# Patient Record
Sex: Female | Born: 1940 | Race: White | Hispanic: No | Smoking: Never smoker
Health system: Southern US, Community
[De-identification: ages and names within clinical notes are randomized; demographics above are authoritative.]

## PROBLEM LIST (undated history)

## (undated) DIAGNOSIS — F0390 Unspecified dementia without behavioral disturbance: Secondary | ICD-10-CM

## (undated) DIAGNOSIS — T7840XA Allergy, unspecified, initial encounter: Secondary | ICD-10-CM

## (undated) DIAGNOSIS — F32A Depression, unspecified: Secondary | ICD-10-CM

## (undated) DIAGNOSIS — E079 Disorder of thyroid, unspecified: Secondary | ICD-10-CM

## (undated) DIAGNOSIS — F329 Major depressive disorder, single episode, unspecified: Secondary | ICD-10-CM

## (undated) HISTORY — DX: Depression, unspecified: F32.A

## (undated) HISTORY — DX: Disorder of thyroid, unspecified: E07.9

## (undated) HISTORY — DX: Allergy, unspecified, initial encounter: T78.40XA

## (undated) HISTORY — DX: Major depressive disorder, single episode, unspecified: F32.9

---

## 1986-10-18 HISTORY — PX: OTHER SURGICAL HISTORY: SHX169

## 1997-02-26 ENCOUNTER — Encounter: Payer: Self-pay | Admitting: Family Medicine

## 1997-02-26 LAB — CONVERTED CEMR LAB: Pap Smear: NORMAL

## 1998-02-25 ENCOUNTER — Other Ambulatory Visit: Admission: RE | Admit: 1998-02-25 | Discharge: 1998-02-25 | Payer: Self-pay | Admitting: Obstetrics and Gynecology

## 1998-08-01 ENCOUNTER — Ambulatory Visit (HOSPITAL_COMMUNITY): Admission: RE | Admit: 1998-08-01 | Discharge: 1998-08-01 | Payer: Self-pay | Admitting: Family Medicine

## 1998-08-01 ENCOUNTER — Encounter: Payer: Self-pay | Admitting: Family Medicine

## 1999-08-20 ENCOUNTER — Encounter: Admission: RE | Admit: 1999-08-20 | Discharge: 1999-08-20 | Payer: Self-pay | Admitting: Family Medicine

## 1999-08-20 ENCOUNTER — Encounter: Payer: Self-pay | Admitting: Family Medicine

## 2000-07-12 ENCOUNTER — Encounter: Payer: Self-pay | Admitting: Neurological Surgery

## 2000-07-12 ENCOUNTER — Ambulatory Visit (HOSPITAL_COMMUNITY): Admission: RE | Admit: 2000-07-12 | Discharge: 2000-07-12 | Payer: Self-pay | Admitting: Neurological Surgery

## 2000-07-24 ENCOUNTER — Ambulatory Visit (HOSPITAL_COMMUNITY): Admission: RE | Admit: 2000-07-24 | Discharge: 2000-07-24 | Payer: Self-pay | Admitting: Neurological Surgery

## 2000-07-24 ENCOUNTER — Encounter: Payer: Self-pay | Admitting: Neurological Surgery

## 2000-08-11 ENCOUNTER — Encounter: Admission: RE | Admit: 2000-08-11 | Discharge: 2000-08-24 | Payer: Self-pay | Admitting: Neurological Surgery

## 2000-08-22 ENCOUNTER — Encounter: Payer: Self-pay | Admitting: Family Medicine

## 2000-08-22 ENCOUNTER — Encounter: Admission: RE | Admit: 2000-08-22 | Discharge: 2000-08-22 | Payer: Self-pay | Admitting: Family Medicine

## 2000-09-01 ENCOUNTER — Other Ambulatory Visit: Admission: RE | Admit: 2000-09-01 | Discharge: 2000-09-01 | Payer: Self-pay | Admitting: Family Medicine

## 2000-09-01 ENCOUNTER — Encounter: Payer: Self-pay | Admitting: Family Medicine

## 2000-09-01 LAB — CONVERTED CEMR LAB: Pap Smear: NORMAL

## 2001-08-23 ENCOUNTER — Encounter: Payer: Self-pay | Admitting: Family Medicine

## 2001-08-23 ENCOUNTER — Encounter: Admission: RE | Admit: 2001-08-23 | Discharge: 2001-08-23 | Payer: Self-pay | Admitting: Family Medicine

## 2001-09-20 ENCOUNTER — Other Ambulatory Visit: Admission: RE | Admit: 2001-09-20 | Discharge: 2001-09-20 | Payer: Self-pay | Admitting: Family Medicine

## 2001-09-25 ENCOUNTER — Encounter: Payer: Self-pay | Admitting: Family Medicine

## 2001-09-25 LAB — CONVERTED CEMR LAB: Pap Smear: NORMAL

## 2002-09-27 ENCOUNTER — Encounter: Payer: Self-pay | Admitting: Family Medicine

## 2002-09-27 ENCOUNTER — Encounter: Admission: RE | Admit: 2002-09-27 | Discharge: 2002-09-27 | Payer: Self-pay | Admitting: Family Medicine

## 2002-10-23 ENCOUNTER — Other Ambulatory Visit: Admission: RE | Admit: 2002-10-23 | Discharge: 2002-10-23 | Payer: Self-pay | Admitting: Family Medicine

## 2002-10-23 ENCOUNTER — Encounter: Payer: Self-pay | Admitting: Family Medicine

## 2002-10-23 LAB — CONVERTED CEMR LAB: Pap Smear: NORMAL

## 2003-02-21 ENCOUNTER — Encounter (INDEPENDENT_AMBULATORY_CARE_PROVIDER_SITE_OTHER): Payer: Self-pay | Admitting: Specialist

## 2003-02-21 ENCOUNTER — Ambulatory Visit (HOSPITAL_COMMUNITY): Admission: RE | Admit: 2003-02-21 | Discharge: 2003-02-21 | Payer: Self-pay | Admitting: *Deleted

## 2003-08-26 HISTORY — PX: OTHER SURGICAL HISTORY: SHX169

## 2003-08-30 ENCOUNTER — Encounter: Admission: RE | Admit: 2003-08-30 | Discharge: 2003-08-30 | Payer: Self-pay | Admitting: Family Medicine

## 2004-06-03 ENCOUNTER — Encounter: Admission: RE | Admit: 2004-06-03 | Discharge: 2004-06-03 | Payer: Self-pay | Admitting: Family Medicine

## 2004-06-05 ENCOUNTER — Encounter: Admission: RE | Admit: 2004-06-05 | Discharge: 2004-06-05 | Payer: Self-pay | Admitting: Surgery

## 2004-06-15 ENCOUNTER — Ambulatory Visit (HOSPITAL_COMMUNITY): Admission: RE | Admit: 2004-06-15 | Discharge: 2004-06-16 | Payer: Self-pay | Admitting: Surgery

## 2004-06-15 ENCOUNTER — Encounter (INDEPENDENT_AMBULATORY_CARE_PROVIDER_SITE_OTHER): Payer: Self-pay | Admitting: Specialist

## 2004-06-15 HISTORY — PX: CHOLECYSTECTOMY: SHX55

## 2004-07-24 ENCOUNTER — Other Ambulatory Visit: Admission: RE | Admit: 2004-07-24 | Discharge: 2004-07-24 | Payer: Self-pay | Admitting: Family Medicine

## 2004-07-27 ENCOUNTER — Encounter: Payer: Self-pay | Admitting: Family Medicine

## 2004-07-27 LAB — CONVERTED CEMR LAB: Pap Smear: NORMAL

## 2004-09-04 ENCOUNTER — Encounter: Admission: RE | Admit: 2004-09-04 | Discharge: 2004-09-04 | Payer: Self-pay | Admitting: Surgery

## 2004-09-17 ENCOUNTER — Ambulatory Visit: Payer: Self-pay | Admitting: Family Medicine

## 2004-10-26 ENCOUNTER — Ambulatory Visit: Payer: Self-pay | Admitting: Family Medicine

## 2004-11-16 ENCOUNTER — Ambulatory Visit: Payer: Self-pay | Admitting: Family Medicine

## 2004-12-02 ENCOUNTER — Ambulatory Visit: Payer: Self-pay | Admitting: Family Medicine

## 2005-01-21 ENCOUNTER — Ambulatory Visit: Payer: Self-pay | Admitting: Family Medicine

## 2005-03-24 ENCOUNTER — Ambulatory Visit: Payer: Self-pay | Admitting: Family Medicine

## 2005-04-19 ENCOUNTER — Ambulatory Visit: Payer: Self-pay | Admitting: Family Medicine

## 2005-04-27 ENCOUNTER — Ambulatory Visit: Payer: Self-pay | Admitting: Family Medicine

## 2005-07-02 ENCOUNTER — Encounter: Admission: RE | Admit: 2005-07-02 | Discharge: 2005-07-02 | Payer: Self-pay | Admitting: Family Medicine

## 2005-07-29 ENCOUNTER — Ambulatory Visit: Payer: Self-pay | Admitting: Family Medicine

## 2005-08-03 ENCOUNTER — Ambulatory Visit: Payer: Self-pay | Admitting: Family Medicine

## 2005-08-04 ENCOUNTER — Encounter: Admission: RE | Admit: 2005-08-04 | Discharge: 2005-08-04 | Payer: Self-pay | Admitting: Family Medicine

## 2005-08-31 ENCOUNTER — Ambulatory Visit: Payer: Self-pay | Admitting: Family Medicine

## 2005-09-14 ENCOUNTER — Ambulatory Visit: Payer: Self-pay | Admitting: Family Medicine

## 2005-11-02 ENCOUNTER — Ambulatory Visit: Payer: Self-pay | Admitting: Family Medicine

## 2005-12-15 ENCOUNTER — Ambulatory Visit: Payer: Self-pay | Admitting: Family Medicine

## 2005-12-20 ENCOUNTER — Ambulatory Visit: Payer: Self-pay | Admitting: Internal Medicine

## 2006-07-20 ENCOUNTER — Encounter: Admission: RE | Admit: 2006-07-20 | Discharge: 2006-07-20 | Payer: Self-pay | Admitting: Family Medicine

## 2006-08-24 ENCOUNTER — Ambulatory Visit: Payer: Self-pay | Admitting: Family Medicine

## 2006-11-29 ENCOUNTER — Ambulatory Visit: Payer: Self-pay | Admitting: Family Medicine

## 2006-11-29 LAB — CONVERTED CEMR LAB
ALT: 18 units/L (ref 0–40)
AST: 24 units/L (ref 0–37)
Albumin: 4.2 g/dL (ref 3.5–5.2)
BUN: 20 mg/dL (ref 6–23)
Basophils Absolute: 0 10*3/uL (ref 0.0–0.1)
Basophils Relative: 0.2 % (ref 0.0–1.0)
CO2: 28 meq/L (ref 19–32)
Calcium: 9.2 mg/dL (ref 8.4–10.5)
Chloride: 103 meq/L (ref 96–112)
Cholesterol: 297 mg/dL (ref 0–200)
Creatinine, Ser: 1 mg/dL (ref 0.4–1.2)
Direct LDL: 201.4 mg/dL
Eosinophils Absolute: 0.1 10*3/uL (ref 0.0–0.6)
Eosinophils Relative: 1.5 % (ref 0.0–5.0)
Folate: >20 ng/mL
GFR calc Af Amer: 72 mL/min
GFR calc non Af Amer: 59 mL/min
Glucose, Bld: 68 mg/dL — ABNORMAL LOW (ref 70–99)
HCT: 36.5 % (ref 36.0–46.0)
HDL: 36.7 mg/dL — ABNORMAL LOW (ref >39.0)
Hemoglobin: 12.6 g/dL (ref 12.0–15.0)
Lymphocytes Relative: 32.7 % (ref 12.0–46.0)
MCHC: 34.6 g/dL (ref 30.0–36.0)
MCV: 87.7 fL (ref 78.0–100.0)
Monocytes Absolute: 0.4 10*3/uL (ref 0.2–0.7)
Monocytes Relative: 9.4 % (ref 3.0–11.0)
Neutro Abs: 2.7 10*3/uL (ref 1.4–7.7)
Neutrophils Relative %: 56.2 % (ref 43.0–77.0)
Phosphorus: 4.1 mg/dL (ref 2.3–4.6)
Platelets: 176 10*3/uL (ref 150–400)
Potassium: 4.7 meq/L (ref 3.5–5.1)
RBC: 4.16 M/uL (ref 3.87–5.11)
RDW: 13.3 % (ref 11.5–14.6)
Sodium: 140 meq/L (ref 135–145)
TSH: 1.14 microintl units/mL (ref 0.35–5.50)
Total CHOL/HDL Ratio: 8.1
Triglycerides: 257 mg/dL (ref 0–149)
VLDL: 51 mg/dL — ABNORMAL HIGH (ref 0–40)
Vit D, 1,25-Dihydroxy: 39 (ref 20–57)
Vitamin B-12: 252 pg/mL (ref 211–911)
WBC: 4.7 10*3/uL (ref 4.5–10.5)

## 2006-12-20 ENCOUNTER — Ambulatory Visit: Payer: Self-pay | Admitting: Family Medicine

## 2006-12-26 ENCOUNTER — Ambulatory Visit: Payer: Self-pay | Admitting: Family Medicine

## 2007-01-18 ENCOUNTER — Encounter: Payer: Self-pay | Admitting: Family Medicine

## 2007-01-18 DIAGNOSIS — I872 Venous insufficiency (chronic) (peripheral): Secondary | ICD-10-CM | POA: Insufficient documentation

## 2007-01-18 DIAGNOSIS — F329 Major depressive disorder, single episode, unspecified: Secondary | ICD-10-CM

## 2007-01-18 DIAGNOSIS — M545 Low back pain, unspecified: Secondary | ICD-10-CM | POA: Insufficient documentation

## 2007-01-18 DIAGNOSIS — F39 Unspecified mood [affective] disorder: Secondary | ICD-10-CM | POA: Insufficient documentation

## 2007-01-18 DIAGNOSIS — F32A Depression, unspecified: Secondary | ICD-10-CM | POA: Insufficient documentation

## 2007-01-18 DIAGNOSIS — M810 Age-related osteoporosis without current pathological fracture: Secondary | ICD-10-CM | POA: Insufficient documentation

## 2007-01-18 DIAGNOSIS — J309 Allergic rhinitis, unspecified: Secondary | ICD-10-CM | POA: Insufficient documentation

## 2007-01-18 DIAGNOSIS — E039 Hypothyroidism, unspecified: Secondary | ICD-10-CM | POA: Insufficient documentation

## 2007-01-18 DIAGNOSIS — M47812 Spondylosis without myelopathy or radiculopathy, cervical region: Secondary | ICD-10-CM | POA: Insufficient documentation

## 2007-04-10 ENCOUNTER — Encounter: Payer: Self-pay | Admitting: Family Medicine

## 2007-07-13 ENCOUNTER — Encounter (INDEPENDENT_AMBULATORY_CARE_PROVIDER_SITE_OTHER): Payer: Self-pay | Admitting: *Deleted

## 2007-07-27 ENCOUNTER — Encounter: Admission: RE | Admit: 2007-07-27 | Discharge: 2007-07-27 | Payer: Self-pay | Admitting: Family Medicine

## 2007-08-02 ENCOUNTER — Encounter (INDEPENDENT_AMBULATORY_CARE_PROVIDER_SITE_OTHER): Payer: Self-pay | Admitting: *Deleted

## 2007-09-20 ENCOUNTER — Ambulatory Visit: Payer: Self-pay | Admitting: Family Medicine

## 2007-10-24 ENCOUNTER — Ambulatory Visit: Payer: Self-pay | Admitting: Family Medicine

## 2007-10-24 DIAGNOSIS — M542 Cervicalgia: Secondary | ICD-10-CM | POA: Insufficient documentation

## 2007-10-24 DIAGNOSIS — J329 Chronic sinusitis, unspecified: Secondary | ICD-10-CM | POA: Insufficient documentation

## 2007-11-16 ENCOUNTER — Telehealth: Payer: Self-pay | Admitting: Family Medicine

## 2008-02-20 ENCOUNTER — Encounter (INDEPENDENT_AMBULATORY_CARE_PROVIDER_SITE_OTHER): Payer: Self-pay | Admitting: *Deleted

## 2008-04-08 ENCOUNTER — Telehealth (INDEPENDENT_AMBULATORY_CARE_PROVIDER_SITE_OTHER): Payer: Self-pay | Admitting: Internal Medicine

## 2008-04-09 ENCOUNTER — Ambulatory Visit: Payer: Self-pay | Admitting: Internal Medicine

## 2008-04-09 DIAGNOSIS — R079 Chest pain, unspecified: Secondary | ICD-10-CM | POA: Insufficient documentation

## 2008-04-10 LAB — CONVERTED CEMR LAB
ALT: 13 units/L (ref 0–35)
AST: 21 units/L (ref 0–37)
BUN: 19 mg/dL (ref 6–23)
Basophils Absolute: 0 10*3/uL (ref 0.0–0.1)
Basophils Relative: 0.1 % (ref 0.0–1.0)
CO2: 29 meq/L (ref 19–32)
Calcium: 9.2 mg/dL (ref 8.4–10.5)
Chloride: 105 meq/L (ref 96–112)
Cholesterol: 296 mg/dL (ref 0–200)
Creatinine, Ser: 1.2 mg/dL (ref 0.4–1.2)
Direct LDL: 195.4 mg/dL
Eosinophils Absolute: 0.1 10*3/uL (ref 0.0–0.7)
Eosinophils Relative: 3 % (ref 0.0–5.0)
GFR calc Af Amer: 58 mL/min
GFR calc non Af Amer: 48 mL/min
Glucose, Bld: 86 mg/dL (ref 70–99)
HCT: 35.7 % — ABNORMAL LOW (ref 36.0–46.0)
HDL: 34.1 mg/dL — ABNORMAL LOW (ref >39.0)
Hemoglobin: 12.3 g/dL (ref 12.0–15.0)
Lymphocytes Relative: 30.7 % (ref 12.0–46.0)
MCHC: 34.6 g/dL (ref 30.0–36.0)
MCV: 88.1 fL (ref 78.0–100.0)
Monocytes Absolute: 0.4 10*3/uL (ref 0.1–1.0)
Monocytes Relative: 9.4 % (ref 3.0–12.0)
Neutro Abs: 2.6 10*3/uL (ref 1.4–7.7)
Neutrophils Relative %: 56.8 % (ref 43.0–77.0)
Platelets: 161 10*3/uL (ref 150–400)
Potassium: 4.5 meq/L (ref 3.5–5.1)
RBC: 4.05 M/uL (ref 3.87–5.11)
RDW: 13 % (ref 11.5–14.6)
Sodium: 142 meq/L (ref 135–145)
TSH: 0.41 microintl units/mL (ref 0.35–5.50)
Total CHOL/HDL Ratio: 8.7
Triglycerides: 194 mg/dL — ABNORMAL HIGH (ref 0–149)
VLDL: 39 mg/dL (ref 0–40)
WBC: 4.5 10*3/uL (ref 4.5–10.5)

## 2008-04-17 ENCOUNTER — Ambulatory Visit: Payer: Self-pay | Admitting: Family Medicine

## 2008-04-17 DIAGNOSIS — Z8679 Personal history of other diseases of the circulatory system: Secondary | ICD-10-CM | POA: Insufficient documentation

## 2008-04-22 ENCOUNTER — Telehealth (INDEPENDENT_AMBULATORY_CARE_PROVIDER_SITE_OTHER): Payer: Self-pay | Admitting: Internal Medicine

## 2008-04-24 ENCOUNTER — Ambulatory Visit: Payer: Self-pay

## 2008-04-24 ENCOUNTER — Encounter (INDEPENDENT_AMBULATORY_CARE_PROVIDER_SITE_OTHER): Payer: Self-pay | Admitting: Internal Medicine

## 2008-04-24 ENCOUNTER — Encounter: Payer: Self-pay | Admitting: Family Medicine

## 2008-04-28 ENCOUNTER — Encounter: Payer: Self-pay | Admitting: Internal Medicine

## 2008-05-02 ENCOUNTER — Telehealth (INDEPENDENT_AMBULATORY_CARE_PROVIDER_SITE_OTHER): Payer: Self-pay | Admitting: Internal Medicine

## 2008-05-20 ENCOUNTER — Ambulatory Visit: Payer: Self-pay | Admitting: Family Medicine

## 2008-05-20 DIAGNOSIS — R5381 Other malaise: Secondary | ICD-10-CM | POA: Insufficient documentation

## 2008-05-20 DIAGNOSIS — R5383 Other fatigue: Secondary | ICD-10-CM

## 2008-05-21 ENCOUNTER — Encounter (INDEPENDENT_AMBULATORY_CARE_PROVIDER_SITE_OTHER): Payer: Self-pay | Admitting: Internal Medicine

## 2008-05-22 ENCOUNTER — Telehealth (INDEPENDENT_AMBULATORY_CARE_PROVIDER_SITE_OTHER): Payer: Self-pay | Admitting: Internal Medicine

## 2008-05-22 LAB — CONVERTED CEMR LAB
Cholesterol: 291 mg/dL (ref 0–200)
Direct LDL: 194.4 mg/dL
HDL: 34.5 mg/dL — ABNORMAL LOW (ref >39.0)
Total CHOL/HDL Ratio: 8.4
Triglycerides: 228 mg/dL (ref 0–149)
VLDL: 46 mg/dL — ABNORMAL HIGH (ref 0–40)
Vitamin B-12: 302 pg/mL (ref 211–911)

## 2008-05-23 LAB — CONVERTED CEMR LAB: Vit D, 1,25-Dihydroxy: 43 (ref 30–89)

## 2008-07-29 ENCOUNTER — Encounter: Admission: RE | Admit: 2008-07-29 | Discharge: 2008-07-29 | Payer: Self-pay | Admitting: Family Medicine

## 2008-07-30 ENCOUNTER — Ambulatory Visit: Payer: Self-pay | Admitting: Family Medicine

## 2008-07-31 LAB — CONVERTED CEMR LAB
ALT: 18 units/L (ref 0–35)
AST: 20 units/L (ref 0–37)
Albumin: 4 g/dL (ref 3.5–5.2)
Alkaline Phosphatase: 40 units/L (ref 39–117)
Bilirubin, Direct: 0.1 mg/dL (ref 0.0–0.3)
Cholesterol: 192 mg/dL (ref 0–200)
HDL: 41.3 mg/dL (ref >39.0)
LDL Cholesterol: 123 mg/dL — ABNORMAL HIGH (ref 0–99)
Total Bilirubin: 0.9 mg/dL (ref 0.3–1.2)
Total CHOL/HDL Ratio: 4.6
Total Protein: 7.3 g/dL (ref 6.0–8.3)
Triglycerides: 137 mg/dL (ref 0–149)
VLDL: 27 mg/dL (ref 0–40)

## 2008-10-31 ENCOUNTER — Encounter: Admission: RE | Admit: 2008-10-31 | Discharge: 2008-10-31 | Payer: Self-pay | Admitting: Family Medicine

## 2008-10-31 ENCOUNTER — Ambulatory Visit: Payer: Self-pay | Admitting: Family Medicine

## 2008-10-31 DIAGNOSIS — M25559 Pain in unspecified hip: Secondary | ICD-10-CM | POA: Insufficient documentation

## 2009-01-27 ENCOUNTER — Ambulatory Visit: Payer: Self-pay | Admitting: Family Medicine

## 2009-01-28 LAB — CONVERTED CEMR LAB
ALT: 19 units/L (ref 0–35)
AST: 25 units/L (ref 0–37)
Cholesterol: 192 mg/dL (ref 0–200)
HDL: 37.8 mg/dL — ABNORMAL LOW (ref >39.00)
LDL Cholesterol: 124 mg/dL — ABNORMAL HIGH (ref 0–99)
Total CHOL/HDL Ratio: 5
Triglycerides: 152 mg/dL — ABNORMAL HIGH (ref 0.0–149.0)
VLDL: 30.4 mg/dL (ref 0.0–40.0)

## 2009-01-30 ENCOUNTER — Encounter (INDEPENDENT_AMBULATORY_CARE_PROVIDER_SITE_OTHER): Payer: Self-pay | Admitting: Internal Medicine

## 2009-01-30 ENCOUNTER — Telehealth (INDEPENDENT_AMBULATORY_CARE_PROVIDER_SITE_OTHER): Payer: Self-pay | Admitting: Internal Medicine

## 2009-01-30 ENCOUNTER — Ambulatory Visit: Payer: Self-pay | Admitting: Family Medicine

## 2009-01-30 DIAGNOSIS — H6120 Impacted cerumen, unspecified ear: Secondary | ICD-10-CM | POA: Insufficient documentation

## 2009-01-30 LAB — CONVERTED CEMR LAB: TSH: 0.11 microintl units/mL — ABNORMAL LOW (ref 0.35–5.50)

## 2009-02-19 ENCOUNTER — Encounter (INDEPENDENT_AMBULATORY_CARE_PROVIDER_SITE_OTHER): Payer: Self-pay | Admitting: Internal Medicine

## 2009-03-13 ENCOUNTER — Encounter (INDEPENDENT_AMBULATORY_CARE_PROVIDER_SITE_OTHER): Payer: Self-pay | Admitting: Internal Medicine

## 2009-03-13 ENCOUNTER — Ambulatory Visit: Payer: Self-pay | Admitting: Family Medicine

## 2009-03-14 LAB — CONVERTED CEMR LAB: TSH: 0.08 microintl units/mL — ABNORMAL LOW (ref 0.35–5.50)

## 2009-03-28 ENCOUNTER — Encounter (INDEPENDENT_AMBULATORY_CARE_PROVIDER_SITE_OTHER): Payer: Self-pay | Admitting: Internal Medicine

## 2009-05-08 ENCOUNTER — Ambulatory Visit: Payer: Self-pay | Admitting: Family Medicine

## 2009-05-09 LAB — CONVERTED CEMR LAB: TSH: 0.49 microintl units/mL (ref 0.35–5.50)

## 2009-07-31 ENCOUNTER — Ambulatory Visit: Payer: Self-pay | Admitting: Family Medicine

## 2009-08-05 LAB — CONVERTED CEMR LAB
HDL: 38.9 mg/dL — ABNORMAL LOW (ref >39.00)
Total CHOL/HDL Ratio: 4
Triglycerides: 177 mg/dL — ABNORMAL HIGH (ref 0.0–149.0)

## 2009-08-13 ENCOUNTER — Ambulatory Visit: Payer: Self-pay | Admitting: Family Medicine

## 2009-08-13 DIAGNOSIS — K219 Gastro-esophageal reflux disease without esophagitis: Secondary | ICD-10-CM | POA: Insufficient documentation

## 2009-09-04 ENCOUNTER — Encounter: Admission: RE | Admit: 2009-09-04 | Discharge: 2009-09-04 | Payer: Self-pay | Admitting: Family Medicine

## 2009-09-16 ENCOUNTER — Ambulatory Visit: Payer: Self-pay | Admitting: Family Medicine

## 2009-09-16 ENCOUNTER — Encounter: Admission: RE | Admit: 2009-09-16 | Discharge: 2009-09-16 | Payer: Self-pay | Admitting: Family Medicine

## 2009-10-03 ENCOUNTER — Telehealth (INDEPENDENT_AMBULATORY_CARE_PROVIDER_SITE_OTHER): Payer: Self-pay | Admitting: Internal Medicine

## 2009-11-07 ENCOUNTER — Ambulatory Visit: Payer: Self-pay | Admitting: Family Medicine

## 2009-11-10 LAB — CONVERTED CEMR LAB
BUN: 16 mg/dL
CO2: 28 meq/L
Calcium: 9.2 mg/dL
Chloride: 105 meq/L
Creatinine, Ser: 1.4 mg/dL — ABNORMAL HIGH
GFR calc non Af Amer: 39.72 mL/min
Glucose, Bld: 90 mg/dL
Potassium: 3.9 meq/L
Sodium: 140 meq/L
TSH: 6.36 u[IU]/mL — ABNORMAL HIGH

## 2009-11-12 ENCOUNTER — Ambulatory Visit: Payer: Self-pay | Admitting: Family Medicine

## 2009-11-12 DIAGNOSIS — D509 Iron deficiency anemia, unspecified: Secondary | ICD-10-CM | POA: Insufficient documentation

## 2009-11-12 DIAGNOSIS — N189 Chronic kidney disease, unspecified: Secondary | ICD-10-CM | POA: Insufficient documentation

## 2009-11-12 DIAGNOSIS — D631 Anemia in chronic kidney disease: Secondary | ICD-10-CM | POA: Insufficient documentation

## 2009-11-12 DIAGNOSIS — Z87448 Personal history of other diseases of urinary system: Secondary | ICD-10-CM | POA: Insufficient documentation

## 2009-11-12 LAB — CONVERTED CEMR LAB
Bilirubin Urine: NEGATIVE
Ketones, urine, test strip: NEGATIVE
Nitrite: NEGATIVE
Urobilinogen, UA: 0.2
pH: 6

## 2009-11-13 LAB — CONVERTED CEMR LAB
Chloride: 107 meq/L (ref 96–112)
Glucose, Bld: 64 mg/dL — ABNORMAL LOW (ref 70–99)
Phosphorus: 3.7 mg/dL (ref 2.3–4.6)
Potassium: 4.1 meq/L (ref 3.5–5.1)
Sodium: 142 meq/L (ref 135–145)
TSH: 6.43 microintl units/mL — ABNORMAL HIGH (ref 0.35–5.50)

## 2009-11-14 ENCOUNTER — Telehealth: Payer: Self-pay | Admitting: Family Medicine

## 2009-11-27 ENCOUNTER — Ambulatory Visit: Payer: Self-pay | Admitting: Family Medicine

## 2009-12-03 ENCOUNTER — Encounter: Payer: Self-pay | Admitting: Family Medicine

## 2009-12-03 DIAGNOSIS — R0989 Other specified symptoms and signs involving the circulatory and respiratory systems: Secondary | ICD-10-CM | POA: Insufficient documentation

## 2009-12-04 ENCOUNTER — Encounter: Payer: Self-pay | Admitting: Family Medicine

## 2009-12-04 ENCOUNTER — Ambulatory Visit: Payer: Self-pay

## 2009-12-08 ENCOUNTER — Encounter: Payer: Self-pay | Admitting: Family Medicine

## 2009-12-15 ENCOUNTER — Telehealth: Payer: Self-pay | Admitting: Family Medicine

## 2009-12-23 ENCOUNTER — Telehealth: Payer: Self-pay | Admitting: Family Medicine

## 2010-01-02 ENCOUNTER — Encounter: Payer: Self-pay | Admitting: Family Medicine

## 2010-01-13 ENCOUNTER — Encounter: Payer: Self-pay | Admitting: Family Medicine

## 2010-01-17 ENCOUNTER — Encounter: Admission: RE | Admit: 2010-01-17 | Discharge: 2010-01-17 | Payer: Self-pay | Admitting: Neurological Surgery

## 2010-01-23 ENCOUNTER — Encounter: Payer: Self-pay | Admitting: Family Medicine

## 2010-02-04 ENCOUNTER — Ambulatory Visit: Payer: Self-pay | Admitting: Family Medicine

## 2010-02-05 LAB — CONVERTED CEMR LAB
Cholesterol: 162 mg/dL (ref 0–200)
Direct LDL: 75.5 mg/dL
TSH: 4.72 microintl units/mL (ref 0.35–5.50)
Triglycerides: 265 mg/dL — ABNORMAL HIGH (ref 0.0–149.0)

## 2010-03-04 ENCOUNTER — Encounter: Payer: Self-pay | Admitting: Family Medicine

## 2010-03-30 ENCOUNTER — Ambulatory Visit: Payer: Self-pay | Admitting: Family Medicine

## 2010-03-31 LAB — CONVERTED CEMR LAB
ALT: 18 units/L (ref 0–35)
Albumin: 4 g/dL (ref 3.5–5.2)
Cholesterol: 226 mg/dL — ABNORMAL HIGH (ref 0–200)
Direct LDL: 126.7 mg/dL
HDL: 44.3 mg/dL (ref >39.00)
Total Protein: 6.8 g/dL (ref 6.0–8.3)
Triglycerides: 316 mg/dL — ABNORMAL HIGH (ref 0.0–149.0)
VLDL: 63.2 mg/dL — ABNORMAL HIGH (ref 0.0–40.0)

## 2010-04-06 ENCOUNTER — Telehealth: Payer: Self-pay | Admitting: Family Medicine

## 2010-05-20 ENCOUNTER — Telehealth: Payer: Self-pay | Admitting: Family Medicine

## 2010-05-25 ENCOUNTER — Telehealth: Payer: Self-pay | Admitting: Family Medicine

## 2010-07-01 ENCOUNTER — Ambulatory Visit: Payer: Self-pay | Admitting: Family Medicine

## 2010-07-02 LAB — CONVERTED CEMR LAB
ALT: 14 units/L (ref 0–35)
AST: 23 units/L (ref 0–37)
Albumin: 3.8 g/dL (ref 3.5–5.2)
Alkaline Phosphatase: 37 units/L — ABNORMAL LOW (ref 39–117)
Cholesterol: 226 mg/dL — ABNORMAL HIGH (ref 0–200)
Total CHOL/HDL Ratio: 5
Total Protein: 6.7 g/dL (ref 6.0–8.3)

## 2010-07-06 ENCOUNTER — Ambulatory Visit: Payer: Self-pay | Admitting: Family Medicine

## 2010-07-06 DIAGNOSIS — R112 Nausea with vomiting, unspecified: Secondary | ICD-10-CM | POA: Insufficient documentation

## 2010-07-07 LAB — CONVERTED CEMR LAB
BUN: 14 mg/dL (ref 6–23)
Bilirubin, Direct: 0.1 mg/dL (ref 0.0–0.3)
Calcium: 9.6 mg/dL (ref 8.4–10.5)
Chloride: 104 meq/L (ref 96–112)
Creatinine, Ser: 1.2 mg/dL (ref 0.4–1.2)
Eosinophils Absolute: 0 10*3/uL (ref 0.0–0.7)
Eosinophils Relative: 0.9 % (ref 0.0–5.0)
MCHC: 34.4 g/dL (ref 30.0–36.0)
MCV: 93.3 fL (ref 78.0–100.0)
Monocytes Absolute: 0.4 10*3/uL (ref 0.1–1.0)
Neutrophils Relative %: 60.5 % (ref 43.0–77.0)
Platelets: 166 10*3/uL (ref 150.0–400.0)
Total Bilirubin: 0.7 mg/dL (ref 0.3–1.2)
WBC: 5.3 10*3/uL (ref 4.5–10.5)

## 2010-07-08 ENCOUNTER — Ambulatory Visit: Payer: Self-pay | Admitting: Family Medicine

## 2010-07-08 DIAGNOSIS — R748 Abnormal levels of other serum enzymes: Secondary | ICD-10-CM | POA: Insufficient documentation

## 2010-07-09 LAB — CONVERTED CEMR LAB
Amylase: 85 units/L (ref 27–131)
Lipase: 77 units/L — ABNORMAL HIGH (ref 11.0–59.0)

## 2010-07-27 ENCOUNTER — Telehealth: Payer: Self-pay | Admitting: Family Medicine

## 2010-07-28 ENCOUNTER — Ambulatory Visit: Payer: Self-pay | Admitting: Family Medicine

## 2010-07-29 LAB — CONVERTED CEMR LAB
Amylase: 92 units/L (ref 27–131)
Lipase: 67 units/L — ABNORMAL HIGH (ref 11.0–59.0)

## 2010-08-20 ENCOUNTER — Telehealth: Payer: Self-pay | Admitting: Family Medicine

## 2010-09-16 ENCOUNTER — Telehealth: Payer: Self-pay | Admitting: Family Medicine

## 2010-09-16 ENCOUNTER — Encounter: Admission: RE | Admit: 2010-09-16 | Discharge: 2010-09-16 | Payer: Self-pay | Admitting: Family Medicine

## 2010-09-18 ENCOUNTER — Telehealth: Payer: Self-pay | Admitting: Family Medicine

## 2010-11-08 ENCOUNTER — Encounter: Payer: Self-pay | Admitting: Family Medicine

## 2010-11-17 NOTE — Miscellaneous (Signed)
Summary: Orders Update  Clinical Lists Changes  Problems: Added new problem of CAROTID BRUIT (ICD-785.9) Orders: Added new Test order of Carotid Duplex (Carotid Duplex) - Signed 

## 2010-11-17 NOTE — Progress Notes (Signed)
Summary: requests pain medicine  Phone Note Call from Patient   Caller: Patient Summary of Call: Pt is taking vicodin for neck pain.  This is making her itch, she is asking if something else can be called to [facility].  She says she is in terrible pain. Initial call taken by: Lowella Petties CMA,  April 06, 2010 9:42 AM  Follow-up for Phone Call        Narcotics all cause itching.  Has she tried something in past that she tolerated better? Follow-up by: Ruthe Mannan MD,  April 06, 2010 9:45 AM  Additional Follow-up for Phone Call Additional follow up Details #1::        Patient says that she can tolerate Hydrocodone 5/500 which was given to her by another doctor even though it still makes her itch.  She says that she breaks the pills in half and that works better for her.  Uses [facility].  Please advise.  Linde Gillis CMA Duncan Dull)  April 06, 2010 9:55 AM   Rx called to [facility], patient notified. Additional Follow-up by: Linde Gillis CMA Duncan Dull),  April 06, 2010 10:05 AM    New/Updated Medications: HYDROCODONE-ACETAMINOPHEN 5-500 MG TABS (HYDROCODONE-ACETAMINOPHEN) 1 tab ever six hours as needed for pain. Prescriptions: HYDROCODONE-ACETAMINOPHEN 5-500 MG TABS (HYDROCODONE-ACETAMINOPHEN) 1 tab ever six hours as needed for pain.  #60 x 0   Entered and Authorized by:   Ruthe Mannan MD   Signed by:   Ruthe Mannan MD on 04/06/2010   Method used:   Telephoned to ...       [facility]* (retail)       [address]       [place], [place]  [address]       Ph: [phone]       Fax: [phone]   RxID:   [id number]

## 2010-11-17 NOTE — Progress Notes (Signed)
Summary: Monica Edwards   Phone Note Refill Request Message from:  Fax from Pharmacy on September 16, 2010 11:25 AM  Refills Requested: Medication #1:  AMBIEN 5 MG TABS 1 tab by mouth at bedtime as needed insomnia.   Last Refilled: 08/21/2010 Refill request from [facility]. [phone].   Initial call taken by: Melody Comas,  September 16, 2010 11:26 AM  Follow-up for Phone Call        refilled with no RF.  please call in. Follow-up by: Eustaquio Boyden  MD,  September 16, 2010 12:34 PM  Additional Follow-up for Phone Call Additional follow up Details #1::        Rx called in as directed Additional Follow-up by: Janee Morn CMA Duncan Dull),  September 16, 2010 12:59 PM    Prescriptions: AMBIEN 5 MG TABS (ZOLPIDEM TARTRATE) 1 tab by mouth at bedtime as needed insomnia  #30 x 0   Entered and Authorized by:   Eustaquio Boyden  MD   Signed by:   Eustaquio Boyden  MD on 09/16/2010   Method used:   Telephoned to ...       [facility]* (retail)       [address]       [place], [place]  [address]       Ph: [phone]       Fax: [phone]   RxID:   [id number]

## 2010-11-17 NOTE — Consult Note (Signed)
Summary: Dr.[facility],Note  Dr.John Wrenn,[facility],Note   Imported By: Beau Fanny 12/09/2009 11:44:45  _____________________________________________________________________  External Attachment:    Type:   Image     Comment:   External Document

## 2010-11-17 NOTE — Assessment & Plan Note (Signed)
Summary: 30 MIN F/U LABWORK/CLE   Vital Signs:  Patient profile:   70 year old female Height:      62 inches Weight:      152.50 pounds BMI:     27.99 Temp:     97.6 degrees F oral Pulse rate:   80 / minute Pulse rhythm:   regular BP sitting:   138 / 84  (left arm) Cuff size:   regular  Vitals Entered By: Delilah Shan CMA Duncan Dull) (November 12, 2009 10:11 AM) CC: 30 min. F/U - labs   History of Present Illness: 70 yo female new to me presents for follow up labs.  Hypothyroidism- has h/o hypothyroidism, has been on Syntrhoitd 100 micrograms daily since 01/2009. Recently started taking it at a different time of day and had forgotten multiple doses prior to these labs being drawn.  TSH on 11/07/09 was 6.36.  Has been feeling more fatigued and constipated lately.  Hair and mouth also feel dry.  Elevated Cr- Cr was 1.4 on 11/07/09.  Looking back at prior OV, her baseline is 1.0 (last checked 11/2006).  Denies being dehydrated prior to this lab draw.   No recent illnesses.  Has had hematuria for years, never been to urology.  No back pain, fevers, chills, dysuria.    Current Medications (verified): 1)  Sertraline Hcl 100 Mg  Tabs (Sertraline Hcl) .... Take One  By Mouth Once Daily 2)  Meclizine Hcl 25 Mg  Tabs (Meclizine Hcl) .Marland Kitchen.. 1 By Mouth Three Times A Day As Needed Dizziness 3)  Ranitidine Hcl 150 Mg  Tabs (Ranitidine Hcl) .... Take One By Mouth Two Times A Day 4)  Zocor 40 Mg  Tabs (Simvastatin) .Marland Kitchen.. 1 Once Daily For Cholesterol 5)  Levothroid 100 Mcg Tabs (Levothyroxine Sodium) .... Take 1 Daily 6)  Aleve 220 Mg Tabs (Naproxen Sodium) .... Two Tabs Twice A Day. 7)  Calcium Carbonate-Vitamin D 600-400 Mg-Unit  Tabs (Calcium Carbonate-Vitamin D) .... Otc As Directed. 8)  Multivitamins   Tabs (Multiple Vitamin) .... One Daily 9)  Fish Oil   Oil (Fish Oil) .... Takes Three Daily  Allergies: 1)  ! * Vancenase 2)  ! * Zyrtec D 3)  ! * Bextra 4)  ! Fosamax 5)  ! * Actonel 6)  !  Vytorin 7)  ! * Miacalcin Ns 8)  ! Pravachol  Review of Systems      See HPI General:  Complains of fatigue and malaise. ENT:  Denies difficulty swallowing. CV:  Denies chest pain or discomfort, palpitations, swelling of feet, and swelling of hands. Resp:  Denies shortness of breath. GI:  Complains of constipation; denies abdominal pain and bloody stools. Psych:  Denies anxiety. Endo:  Complains of cold intolerance; denies excessive thirst, excessive urination, heat intolerance, and polyuria.  Physical Exam  General:  alert, well-developed, well-nourished, and well-hydrated.   Mouth:  MMM Neck:  No deformities, masses, or tenderness noted. Lungs:  Normal respiratory effort, chest expands symmetrically. Lungs are clear to auscultation, no crackles or wheezes. Heart:  Normal rate and regular rhythm. S1 and S2 normal without gallop, murmur, click, rub or other extra sounds. Abdomen:  NO suprapubic tenderness NO CVA tenderness Extremities:  no edema Psych:  normally interactive, not anxious appearing, and subdued.     Impression & Recommendations:  Problem # 1:  HYPOTHYROIDISM (ICD-244.9) Assessment Deteriorated Keep current dosage of Levothyroid as she admits to skipping doses.  Recheck TSH today and again in 1 month. Her  updated medication list for this problem includes:    Levothroid 100 Mcg Tabs (Levothyroxine sodium) .Marland Kitchen... Take 1 daily  Orders: TLB-TSH (Thyroid Stimulating Hormone) (84443-TSH)  Problem # 2:  HEMATURIA, HX OF (ICD-V13.09) Assessment: Unchanged Urine showing hematuria again today.  Will send to urology for work up. Orders: UA Dipstick w/o Micro (manual) (99833) Urology Referral (Urology)  Problem # 3:  RENAL INSUFFICIENCY, ACUTE (ICD-585.9) Assessment: New Hopefully prerenal and has resolved.  Will recheck RFP today. Orders: Venipuncture (82505) TLB-Renal Function Panel (80069-RENAL)  Complete Medication List: 1)  Sertraline Hcl 100 Mg Tabs  (Sertraline hcl) .... Take one  by mouth once daily 2)  Meclizine Hcl 25 Mg Tabs (Meclizine hcl) .Marland Kitchen.. 1 by mouth three times a day as needed dizziness 3)  Ranitidine Hcl 150 Mg Tabs (Ranitidine hcl) .... Take one by mouth two times a day 4)  Zocor 40 Mg Tabs (Simvastatin) .Marland Kitchen.. 1 once daily for cholesterol 5)  Levothroid 100 Mcg Tabs (Levothyroxine sodium) .... Take 1 daily 6)  Aleve 220 Mg Tabs (Naproxen sodium) .... Two tabs twice a day. 7)  Calcium Carbonate-vitamin D 600-400 Mg-unit Tabs (Calcium carbonate-vitamin d) .... Otc as directed. 8)  Multivitamins Tabs (Multiple vitamin) .... One daily 9)  Fish Oil Oil (Fish oil) .... Takes three daily  Other Orders: Cerumen Impaction Removal (39767)  Patient Instructions: 1)  It was to meet you. 2)  I want you to stop by to see Shirlee Limerick on your way out to set up your urology referral.  Current Allergies (reviewed today): ! * VANCENASE ! * ZYRTEC D ! * BEXTRA ! FOSAMAX ! * ACTONEL ! VYTORIN ! * MIACALCIN NS ! PRAVACHOL  Laboratory Results   Urine Tests   Date/Time Reported: November 12, 2009 10:36 AM   Routine Urinalysis   Color: yellow Appearance: Clear Glucose: negative   (Normal Range: Negative) Bilirubin: negative   (Normal Range: Negative) Ketone: negative   (Normal Range: Negative) Spec. Gravity: 1.010   (Normal Range: 1.003-1.035) Blood: small   (Normal Range: Negative) pH: 6.0   (Normal Range: 5.0-8.0) Protein: negative   (Normal Range: Negative) Urobilinogen: 0.2   (Normal Range: 0-1) Nitrite: negative   (Normal Range: Negative) Leukocyte Esterace: negative   (Normal Range: Negative)

## 2010-11-17 NOTE — Assessment & Plan Note (Signed)
Summary: LEFT NECK PAIN/RBH   Vital Signs:  Patient profile:   70 year old female Height:      62 inches Weight:      152.50 pounds BMI:     27.99 Temp:     97.7 degrees F oral Pulse rate:   84 / minute Pulse rhythm:   regular BP sitting:   132 / 80  (left arm) Cuff size:   regular  Vitals Entered By: Delilah Shan CMA Duncan Dull) (November 27, 2009 12:31 PM) CC: Left neck pain   History of Present Illness: 70 yo here for 6 weeks of left neck pain. Seen for same symptoms January of 2009. Has known cervical disease and TMJ, left arm always tingles, no worse. No left arm wekaness. Has chronic dizziness as well, no worse. No CP, no SOB.  Does have h/o HLD- on Zocor.  Under great deal of stress, husband has Alzheimers.   Zoloft helps a lot.  Current Medications (verified): 1)  Sertraline Hcl 100 Mg  Tabs (Sertraline Hcl) .... Take One  By Mouth Once Daily 2)  Meclizine Hcl 25 Mg  Tabs (Meclizine Hcl) .Marland Kitchen.. 1 By Mouth Three Times A Day As Needed Dizziness 3)  Ranitidine Hcl 150 Mg  Tabs (Ranitidine Hcl) .... Take One By Mouth Two Times A Day 4)  Zocor 40 Mg  Tabs (Simvastatin) .Marland Kitchen.. 1 Once Daily For Cholesterol 5)  Levothroid 100 Mcg Tabs (Levothyroxine Sodium) .... Take 1 Daily 6)  Aleve 220 Mg Tabs (Naproxen Sodium) .... Two Tabs Twice A Day. 7)  Calcium Carbonate-Vitamin D 600-400 Mg-Unit  Tabs (Calcium Carbonate-Vitamin D) .... Otc As Directed. 8)  Multivitamins   Tabs (Multiple Vitamin) .... One Daily 9)  Fish Oil   Oil (Fish Oil) .... Takes Three Daily 10)  Cyclobenzaprine Hcl 10 Mg  Tabs (Cyclobenzaprine Hcl) .Marland Kitchen.. 1 By Mouth 2 Times Daily As Needed For Back Pain  Allergies: 1)  ! * Vancenase 2)  ! * Zyrtec D 3)  ! * Bextra 4)  ! Fosamax 5)  ! * Actonel 6)  ! Vytorin 7)  ! * Miacalcin Ns 8)  ! Pravachol  Review of Systems      See HPI General:  Denies chills and fever. Eyes:  Denies blurring. ENT:  Denies difficulty swallowing. CV:  Denies chest pain or  discomfort, lightheadness, near fainting, palpitations, shortness of breath with exertion, swelling of feet, and swelling of hands. Resp:  Denies shortness of breath. Neuro:  Complains of tingling; denies difficulty with concentration, disturbances in coordination, headaches, inability to speak, memory loss, numbness, seizures, sensation of room spinning, tremors, visual disturbances, and weakness. Psych:  Complains of anxiety; denies depression.  Physical Exam  General:  alert, well-developed, well-nourished, and well-hydrated.   Neck:  tender of lower cervical spinous muscles, pain to felxion greater than 30 deg, ext 30 degress, FROM. no bruits Lungs:  Normal respiratory effort, chest expands symmetrically. Lungs are clear to auscultation, no crackles or wheezes. Heart:  Normal rate and regular rhythm. S1 and S2 normal without gallop, murmur, click, rub or other extra sounds. Neurologic:  alert & oriented X3, cranial nerves II-XII intact, and strength normal in all extremities.   Psych:  normally interactive, not anxious appearing, and subdued.     Impression & Recommendations:  Problem # 1:  NECK PAIN (ICD-723.1) Assessment Deteriorated with h/o cervical spondylosis.  Will try muscle relaxants, [place] need PT. Given h/o HLD will also get carotid dopplers to r/o stenosis. Her  updated medication list for this problem includes:    Aleve 220 Mg Tabs (Naproxen sodium) .Marland Kitchen..Marland Kitchen Two tabs twice a day.    Cyclobenzaprine Hcl 10 Mg Tabs (Cyclobenzaprine hcl) .Marland Kitchen... 1 by mouth 2 times daily as needed for back pain  Orders: Radiology Referral (Radiology) Prescription Created Electronically [id number])  Complete Medication List: 1)  Sertraline Hcl 100 Mg Tabs (Sertraline hcl) .... Take one  by mouth once daily 2)  Meclizine Hcl 25 Mg Tabs (Meclizine hcl) .Marland Kitchen.. 1 by mouth three times a day as needed dizziness 3)  Ranitidine Hcl 150 Mg Tabs (Ranitidine hcl) .... Take one by mouth two times a day 4)  Zocor  40 Mg Tabs (Simvastatin) .Marland Kitchen.. 1 once daily for cholesterol 5)  Levothroid 100 Mcg Tabs (Levothyroxine sodium) .... Take 1 daily 6)  Aleve 220 Mg Tabs (Naproxen sodium) .... Two tabs twice a day. 7)  Calcium Carbonate-vitamin D 600-400 Mg-unit Tabs (Calcium carbonate-vitamin d) .... Otc as directed. 8)  Multivitamins Tabs (Multiple vitamin) .... One daily 9)  Fish Oil Oil (Fish oil) .... Takes three daily 10)  Cyclobenzaprine Hcl 10 Mg Tabs (Cyclobenzaprine hcl) .Marland Kitchen.. 1 by mouth 2 times daily as needed for back pain  Patient Instructions: 1)  Let try muscle relaxants for your neck. 2)  Please stop by to see Shirlee Limerick on your way out. Prescriptions: CYCLOBENZAPRINE HCL 10 MG  TABS (CYCLOBENZAPRINE HCL) 1 by mouth 2 times daily as needed for back pain  #20 x 0   Entered and Authorized by:   Ruthe Mannan MD   Signed by:   Ruthe Mannan MD on 11/27/2009   Method used:   Electronically to        [facility]* (retail)       [address]       [place], [place]  [address]       Ph: [phone]       Fax: [phone]   RxID:   [id number]   Current Allergies (reviewed today): ! * VANCENASE ! * ZYRTEC D ! * BEXTRA ! FOSAMAX ! * ACTONEL ! VYTORIN ! * MIACALCIN NS ! PRAVACHOL

## 2010-11-17 NOTE — Progress Notes (Signed)
Summary: refill request for vicodin  Phone Note Refill Request Message from:  Fax from Pharmacy  Refills Requested: Medication #1:  HYDROCODONE-ACETAMINOPHEN 5-500 MG TABS 1 tab ever six hours as needed for pain..   Last Refilled: 04/06/2010 Faxed request from [facility], [phone].  Initial call taken by: Lowella Petties CMA,  May 25, 2010 2:54 PM    Prescriptions: HYDROCODONE-ACETAMINOPHEN 5-500 MG TABS (HYDROCODONE-ACETAMINOPHEN) 1 tab ever six hours as needed for pain.  #60 x 0   Entered and Authorized by:   Ruthe Mannan MD   Signed by:   Ruthe Mannan MD on 05/25/2010   Method used:   Print then Give to Patient   RxID:   [id number]   Appended Document: refill request for vicodin Rx called to [facility] pharmacy.

## 2010-11-17 NOTE — Progress Notes (Signed)
Summary: neck pain is no better  Phone Note Call from Patient Call back at Home Phone [phone]   Caller: Patient Summary of Call: Pt was seen in february for neck pain, this is not any better.  She says the muscle relaxer helps some but the pain is still there.  She is asking if she can get x-rays, she prefers to go to [facility],  please advise.   Initial call taken by: Lowella Petties CMA,  December 23, 2009 9:50 AM  Follow-up for Phone Call        since Dr Dayton Martes saw her for this- want to hold for her review when she returns tomorrow  will route to her as well Follow-up by: Judith Part MD,  December 23, 2009 10:25 AM  Additional Follow-up for Phone Call Additional follow up Details #1::        Patient notified as instructed by telephone. Pt will await to hear from Dr Dayton Martes.Lewanda Rife LPN  December 24, 979 3:12 PM     Additional Follow-up for Phone Call Additional follow up Details #2::    Given her history of prior cervical disease, would like to refer to ortho for further work up.  Please schedule and inform patient. Follow-up by: Ruthe Mannan MD,  December 24, 2009 10:35 AM  Additional Follow-up for Phone Call Additional follow up Details #3:: Details for Additional Follow-up Action Taken: Appt made with Dr Ophelia Charter on 01/01/2010 notes faxed. Additional Follow-up by: Carlton Adam,  December 24, 2009 3:03 PM

## 2010-11-17 NOTE — Assessment & Plan Note (Signed)
Summary: 30 min appt  6 month follow up per billie/rbh   Vital Signs:  Patient profile:   70 year old female Height:      62 inches Weight:      151.25 pounds BMI:     27.76 Temp:     97.6 degrees F oral Pulse rate:   80 / minute Pulse rhythm:   regular BP sitting:   120 / 70  (right arm) Cuff size:   regular  Vitals Entered By: Linde Gillis CMA Duncan Dull) (March 30, 2010 9:55 AM) CC: Follow up   History of Present Illness: 70 yo here for follow up.  1.  Left neck pain- saw neurosurgeon, Dr. Danielle Dess on 5/18.  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 He is opting for conservative management at this point. She is wearing a soft cervical collar, which she thinks is helping a little. Also now has her on Relafen and hydrocodone.   She ultimately would rather have surgery as she does not want to be on narcotics for the rest of her life.  2.  HLD- TG elevated in April, 265 (was 177 in October).  Currently on Zocor 40 mg daily.  Admits to eating more sweets and carbs with her husband and now her health declining.  Current Medications (verified): 1)  Sertraline Hcl 100 Mg  Tabs (Sertraline Hcl) .... Take One  By Mouth Once Daily 2)  Meclizine Hcl 25 Mg  Tabs (Meclizine Hcl) .Marland Kitchen.. 1 By Mouth Three Times A Day As Needed Dizziness 3)  Ranitidine Hcl 150 Mg  Tabs (Ranitidine Hcl) .... Take One By Mouth Two Times A Day 4)  Zocor 40 Mg  Tabs (Simvastatin) .Marland Kitchen.. 1 Once Daily For Cholesterol 5)  Levothroid 100 Mcg Tabs (Levothyroxine Sodium) .... Take 1 Daily 6)  Calcium Carbonate-Vitamin D 600-400 Mg-Unit  Tabs (Calcium Carbonate-Vitamin D) .... Otc As Directed. 7)  Multivitamins   Tabs (Multiple Vitamin) .... One Daily 8)  Fish Oil   Oil (Fish Oil) .... Takes Three Daily  Allergies: 1)  ! * Vancenase 2)  ! * Zyrtec D 3)  ! * Bextra 4)  ! Fosamax 5)  ! * Actonel 6)  ! Vytorin 7)  ! * Miacalcin Ns 8)  ! 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 gallstones 7/05 Lap. cholecystectomy 06/15/2004 Pulmonary nodule 12/05 Carotid dopplers neg 01/06 Dexa decreased BMD - OP 2/06 C-S spondylosis/OA 11804 L-S deg. disk disease Endo. bx Dr. Laureen Ochs neg 6/992D echom transthoracic--EF 60%--[place]--04/24/08  Review of Systems      See HPI General:  Denies fever. CV:  Denies chest pain or discomfort. MS:  Denies loss of strength. Psych:  Denies anxiety, depression, panic attacks, sense of great danger, suicidal thoughts/plans, thoughts of violence, unusual visions or sounds, and thoughts /plans of harming others.  Physical Exam  General:  alert, well-developed, well-nourished, and well-hydrated.   Mouth:  MMM Neck:  tender of lower cervical spinous muscles, pain to felxion greater than 30 deg, ext 30 degress, FROM. no bruits Lungs:  Normal respiratory effort, chest expands symmetrically. Lungs are clear to auscultation, no crackles or wheezes. Heart:  Normal rate and regular rhythm. S1 and S2 normal  without gallop, murmur, click, rub or other extra sounds. Extremities:  no edema Psych:  normally interactive, not anxious appearing, and subdued.     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 Advised continue conservation management.  If symptoms become worse, then we need to reasssess surgical options.  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  She will think about it and let me know when she feels her  symptoms have become too much for her to handle.  Problem # 2:  HYPERCHOLESTEROLEMIA- INTOL.OF PRAVACHOL, LOPID NO EFFECT (ICD-272.0) Assessment: Deteriorated TG elevated. REcheck today before adjusting medications. Her updated medication list for this problem includes:    Zocor 40 Mg Tabs (Simvastatin) .Marland Kitchen... 1 once daily for cholesterol  Orders: Venipuncture ([address]) TLB-Lipid Panel (80061-LIPID) TLB-Hepatic/Liver Function Pnl (80076-HEPATIC)  Complete Medication List: 1)  Sertraline Hcl 100 Mg Tabs (Sertraline hcl) .... Take one  by mouth once daily 2)  Meclizine Hcl 25 Mg Tabs (Meclizine hcl) .Marland Kitchen.. 1 by mouth three times a day as needed dizziness 3)  Ranitidine Hcl 150 Mg Tabs (Ranitidine hcl) .... Take one by mouth two times a day 4)  Zocor 40 Mg Tabs (Simvastatin) .Marland Kitchen.. 1 once daily for cholesterol 5)  Levothroid 100 Mcg Tabs (Levothyroxine sodium) .... Take 1 daily 6)  Calcium Carbonate-vitamin D 600-400 Mg-unit Tabs (Calcium carbonate-vitamin d) .... Otc as directed. 7)  Multivitamins Tabs (Multiple vitamin) .... One daily 8)  Fish Oil Oil (Fish oil) .... Takes three daily  Current Allergies (reviewed today): ! * VANCENASE ! * ZYRTEC D ! * BEXTRA ! FOSAMAX ! * ACTONEL ! VYTORIN ! * MIACALCIN NS ! PRAVACHOL

## 2010-11-17 NOTE — Letter (Signed)
Summary: [facility]  [facility]   Imported By: Maryln Gottron 01/06/2010 15:48:06  _____________________________________________________________________  External Attachment:    Type:   Image     Comment:   External Document

## 2010-11-17 NOTE — Progress Notes (Signed)
Summary: can't sleep at night   Phone Note Call from Patient Call back at Home Phone [phone]   Caller: Patient Call For: Ruthe Mannan MD Summary of Call: Patient says that she has alot going on at home with her husband and taking care of him. She says that she has a hard time sleeping at night because once she lays down every thing just runs threw her head and she can't fall asleep. She is says that some nights she just lays in bed all night and never falls asleep. She is asking if she can get something called in to [facility] to help her sleep at night. Pleae advise.  Initial call taken by: Melody Comas,  August 20, 2010 4:44 PM  Follow-up for Phone Call        ok ,will send in [facility]. Ruthe Mannan MD  August 21, 2010 10:20 AM  Rx called to pharmacy.  Patient wants to know if she can continue to take the Zolft in the morning and the Ambien at night?  Please advise.  Linde Gillis CMA Duncan Dull)  August 21, 2010 11:27 AM    Additional Follow-up for Phone Call Additional follow up Details #1::        yes, that is fine. Ruthe Mannan MD  August 21, 2010 11:30 AM  Patient advised as instructed via telephone.  Additional Follow-up by: Linde Gillis CMA Duncan Dull),  August 21, 2010 11:33 AM    New/Updated Medications: AMBIEN 5 MG TABS (ZOLPIDEM TARTRATE) 1 tab by mouth at bedtime as needed insomnia Prescriptions: AMBIEN 5 MG TABS (ZOLPIDEM TARTRATE) 1 tab by mouth at bedtime as needed insomnia  #30 x 0   Entered and Authorized by:   Ruthe Mannan MD   Signed by:   Linde Gillis CMA (AAMA) on 08/21/2010   Method used:   Telephoned to ...       [facility]* (retail)       [address]       [place], [place]  [address]       Ph: [phone]       Fax: [phone]   RxID:   [id number]

## 2010-11-17 NOTE — Letter (Signed)
Summary: [facility]  [facility]   Imported By: Maryln Gottron 01/21/2010 14:26:27  _____________________________________________________________________  External Attachment:    Type:   Image     Comment:   External Document

## 2010-11-17 NOTE — Progress Notes (Signed)
Summary: raitidine  Phone Note Refill Request Call back at Home Phone [phone] Message from:  Patient on May 20, 2010 11:42 AM  Refills Requested: Medication #1:  RANITIDINE HCL 150 MG  TABS take one by mouth two times a day [facility]  Initial call taken by: Melody Comas,  May 20, 2010 11:42 AM    Prescriptions: RANITIDINE HCL 150 MG  TABS (RANITIDINE HCL) take one by mouth two times a day  #180 x 3   Entered and Authorized by:   Ruthe Mannan MD   Signed by:   Ruthe Mannan MD on 05/20/2010   Method used:   Electronically to        [facility]* (retail)       [address]       [place], [place]  [address]       Ph: [phone]       Fax: [phone]   RxID:   [id number]

## 2010-11-17 NOTE — Assessment & Plan Note (Signed)
Summary: DISCUSS CHOLESTEROL PER DR ARON/NT   Vital Signs:  Patient profile:   70 year old female Height:      62 inches Weight:      146 pounds BMI:     26.80 Temp:     97.8 degrees F oral Pulse rate:   68 / minute Pulse rhythm:   regular BP sitting:   138 / 80  (right arm) Cuff size:   regular  Vitals Entered By: Linde Gillis CMA Duncan Dull) (July 06, 2010 2:27 PM) CC: discuss cholesterol    History of Present Illness: 70 yo here to discuss cholesterol and nausea/vomiting.  Hyperlipidemia- On Zocor 40 mg daily and fish oil.  Cholesterol is much better than it was several years ago, LDL in 200s in 2008, but LDL is slowly trending up. LDL was 75.5 in 01/2010 and 126.7 in 03/2010, now 156. TG however, have great improvely.  Now 123, was 316 in 03/2010. Husband's dementia is getting much worse so she is walking much less. She has cut out sweets from her diet which helps to explain her improved TG and 5 pounds weight loss since last office visit.  Nausea/vomiting- has been nauseated on and off for past month, only vomited once.  Prior to vomiting, had severe epigastric pain.  Since then, nauseated once a week (sometimes more) after eating.  s/p cholecystectomy in 2005.  No fevers or chills.  Weight loss has been intentional.  Sometimes notices that her stools are more yellow but no diarrhea. Does admit to some reflux at night.  Cerumen impaction (left)- tried to get wax out on her own, could not get it out.  Hearing is worse in that ear today.  No drainage, pain, fever or chills.  Current Medications (verified): 1)  Sertraline Hcl 100 Mg  Tabs (Sertraline Hcl) .... Take One  By Mouth Once Daily 2)  Meclizine Hcl 25 Mg  Tabs (Meclizine Hcl) .Marland Kitchen.. 1 By Mouth Three Times A Day As Needed Dizziness 3)  Ranitidine Hcl 150 Mg  Tabs (Ranitidine Hcl) .... Take One By Mouth Two Times A Day 4)  Zocor 40 Mg  Tabs (Simvastatin) .Marland Kitchen.. 1 Once Daily For Cholesterol 5)  Levothroid 100 Mcg Tabs  (Levothyroxine Sodium) .... Take 1 Daily 6)  Calcium Carbonate-Vitamin D 600-400 Mg-Unit  Tabs (Calcium Carbonate-Vitamin D) .... Otc As Directed. 7)  Multivitamins   Tabs (Multiple Vitamin) .... One Daily 8)  Fish Oil   Oil (Fish Oil) .... Takes Three Daily 9)  Hydrocodone-Acetaminophen 5-500 Mg Tabs (Hydrocodone-Acetaminophen) .Marland Kitchen.. 1 Tab Ever Six Hours As Needed For Pain.  Allergies: 1)  ! * Vancenase 2)  ! * Zyrtec D 3)  ! * Bextra 4)  ! Fosamax 5)  ! * Actonel 6)  ! Vytorin 7)  ! * Miacalcin Ns 8)  ! Pravachol  Past History:  Past Medical History: Last updated: 01/18/2007 Allergic rhinitis Depression Hypothyroidism Osteoporosis 2006  Past Surgical History: Last updated: 04/28/2008 Brain Surgery angionoma AVM 1988 MRI brain (per pt.) no change 2001 dexa-mild osteopenia-femoral neck 12/01 Colonoscopy 1 polyp  5/04 U/S abd. gallstones 7/05 Lap. cholecystectomy 06/15/2004 Pulmonary nodule 12/05 Carotid dopplers neg 01/06 Dexa decreased BMD - OP 2/06 C-S spondylosis/OA 11804 L-S deg. disk disease Endo. bx Dr. Laureen Ochs neg 6/992D echom transthoracic--EF 60%--[place]--04/24/08  Social History: Last updated: 05/20/2008 Marital Status: Married Children:  Occupation:   Risk Factors: Smoking Status: never (01/18/2007) Passive Smoke Exposure: no (05/20/2008)  Review of Systems  See HPI General:  Denies chills and fever. Eyes:  Denies blurring. ENT:  Complains of decreased hearing; denies difficulty swallowing, earache, nasal congestion, and postnasal drainage. CV:  Denies chest pain or discomfort. Resp:  Denies shortness of breath. GI:  Complains of abdominal pain, nausea, and vomiting; denies yellowish skin color.  Physical Exam  General:  alert, well-developed, well-nourished, and well-hydrated.   Ears:  cerumen impaction, left ear Mouth:  MMM Lungs:  Normal respiratory effort, chest expands symmetrically. Lungs are clear to auscultation, no crackles or  wheezes. Heart:  Normal rate and regular rhythm. S1 and S2 normal without gallop, murmur, click, rub or other extra sounds. Abdomen:  NO suprapubic tenderness NO CVA tenderness Pos BS, soft and NT. Extremities:  no edema Neurologic:  alert & oriented X3, cranial nerves II-XII intact, and strength normal in all extremities.   Psych:  normally interactive, not anxious appearing, and subdued.     Impression & Recommendations:  Problem # 1:  NAUSEA AND VOMITING (ICD-787.01) Assessment New Likely combination of GERD that was acutely worsened last month by gastroenteritis. Advised restarting her Prilosec OTC daily. Will also check labs to rule out other causes, such as Lipase and LFTs.  Problem # 2:  CERUMEN IMPACTION, LEFT (ICD-380.4) Assessment: New Cerumen removed successfully with irrigation and metal currette.  Tolerated procedure well.  Problem # 3:  HYPERCHOLESTEROLEMIA- INTOL.OF PRAVACHOL, LOPID NO EFFECT (ICD-272.0) Assessment: Deteriorated Will continue to work on diet.  Recheck in 3-6 months. Her updated medication list for this problem includes:    Zocor 40 Mg Tabs (Simvastatin) .Marland Kitchen... 1 once daily for cholesterol  Complete Medication List: 1)  Sertraline Hcl 100 Mg Tabs (Sertraline hcl) .... Take one  by mouth once daily 2)  Meclizine Hcl 25 Mg Tabs (Meclizine hcl) .Marland Kitchen.. 1 by mouth three times a day as needed dizziness 3)  Ranitidine Hcl 150 Mg Tabs (Ranitidine hcl) .... Take one by mouth two times a day 4)  Zocor 40 Mg Tabs (Simvastatin) .Marland Kitchen.. 1 once daily for cholesterol 5)  Levothroid 100 Mcg Tabs (Levothyroxine sodium) .... Take 1 daily 6)  Calcium Carbonate-vitamin D 600-400 Mg-unit Tabs (Calcium carbonate-vitamin d) .... Otc as directed. 7)  Multivitamins Tabs (Multiple vitamin) .... One daily 8)  Fish Oil Oil (Fish oil) .... Takes three daily 9)  Hydrocodone-acetaminophen 5-500 Mg Tabs (Hydrocodone-acetaminophen) .Marland Kitchen.. 1 tab ever six hours as needed for pain.  Current  Allergies (reviewed today): ! * VANCENASE ! * ZYRTEC D ! * BEXTRA ! FOSAMAX ! * ACTONEL ! VYTORIN ! * MIACALCIN NS ! PRAVACHOL

## 2010-11-17 NOTE — Letter (Signed)
Summary: [facility]  [facility]   Imported By: Lanelle Bal 03/23/2010 10:34:28  _____________________________________________________________________  External Attachment:    Type:   Image     Comment:   External Document

## 2010-11-17 NOTE — Progress Notes (Signed)
Summary: refill request for flexeril  Phone Note Refill Request Message from:  Fax from Pharmacy  Refills Requested: Medication #1:  CYCLOBENZAPRINE HCL 10 MG  TABS 1 by mouth 2 times daily as needed for back pain.   Last Refilled: 11/27/2009 Faxed request from [facility], phone [phone].  Initial call taken by: Lowella Petties CMA,  December 15, 2009 11:18 AM    Prescriptions: CYCLOBENZAPRINE HCL 10 MG  TABS (CYCLOBENZAPRINE HCL) 1 by mouth 2 times daily as needed for back pain  #30 x 0   Entered and Authorized by:   Shaune Leeks MD   Signed by:   Shaune Leeks MD on 12/15/2009   Method used:   Electronically to        [facility]* (retail)       [address]       [place], [place]  [address]       Ph: [phone]       Fax: [phone]   RxID:   [id number]

## 2010-11-17 NOTE — Progress Notes (Signed)
Summary: refill request for  zocor  Phone Note Refill Request Message from:  Patient  Refills Requested: Medication #1:  ZOCOR 40 MG  TABS 1 once daily for cholesterol Fax form for prescription solutions is on your desk.  Initial call taken by: Lowella Petties CMA,  November 14, 2009 4:52 PM  Follow-up for Phone Call        Formed filled out.  On my desk. Follow-up by: Ruthe Mannan MD,  November 17, 2009 7:48 AM     Appended Document: refill request for  zocor Faxed in early a.m. of 11/17/2009

## 2010-11-17 NOTE — Assessment & Plan Note (Signed)
Summary: FLU SHOT/DLO   Nurse Visit   Allergies: 1)  ! * Vancenase 2)  ! * Zyrtec D 3)  ! * Bextra 4)  ! Fosamax 5)  ! * Actonel 6)  ! Vytorin 7)  ! * Miacalcin Ns 8)  ! Pravachol  Orders Added: 1)  Flu Vaccine 53yrs + MEDICARE PATIENTS [Q2039] 2)  Administration Flu vaccine - MCR [G0008]   Flu Vaccine Consent Questions     Do you have a history of severe allergic reactions to this vaccine? no    Any prior history of allergic reactions to egg and/or gelatin? no    Do you have a sensitivity to the preservative Thimersol? no    Do you have a past history of Guillan-Barre Syndrome? no    Do you currently have an acute febrile illness? no    Have you ever had a severe reaction to latex? no    Vaccine information given and explained to patient? yes    Are you currently pregnant? no    Lot Number:[id number]   Exp Date:04/17/2011   Site Given  Left Deltoid IMu

## 2010-11-17 NOTE — Progress Notes (Signed)
Summary: wants CT done  Phone Note Call from Patient Call back at Home Phone [phone]   Caller: Patient Call For: Monica Mannan MD Summary of Call: Pt has now decided she does want a CT of her pancreas done.  She prefers to go to [facility].   Initial call taken by: Lowella Petties CMA,  July 27, 2010 3:35 PM  Follow-up for Phone Call        It's been almost a month since we checked her labs. I would check her amylase and lipase again first. Monica Mannan MD  July 27, 2010 3:45 PM  Patient advised as instructed via telephone.  Lab appt made for 07/28/2010.    Follow-up by: Linde Gillis CMA Duncan Dull),  July 27, 2010 4:52 PM

## 2010-11-17 NOTE — Progress Notes (Signed)
Summary: wants something for anxiety  Phone Note Call from Patient Call back at Home Phone [phone]   Caller: Patient Call For: Ruthe Mannan MD Summary of Call: Pt asks if you can give her something for stress, nerves.  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  [facility]ses [facility], is ok to wait until monday. Initial call taken by: Lowella Petties CMA, AAMA,  September 18, 2010 12:37 PM  Follow-up for Phone Call        please have her make appt to discuss so we can figure if she needs something as needed or daily.   Ruthe Mannan MD  September 18, 2010 2:49 PM  Spoke with pt, she says she cant come in anytime soon.  She has no one to stay with her husband, says she will tough it out.  I told her it wont take that long, she said she will come in eventually but not anytime soon. Follow-up by: Lowella Petties CMA, AAMA,  September 18, 2010 3:15 PM  Additional Follow-up for Phone Call Additional follow up Details #1::        please send in a short rx for alprazolam 0.25 mg- 1 tab by mouth three times a day as needed anxiety, number 30 with no refills. Ruthe Mannan MD  September 18, 2010 3:31 PM  [facility] for pt to call. Additional Follow-up by: Lowella Petties CMA, AAMA,  September 21, 2010 8:24 AM    Additional Follow-up for Phone Call Additional follow up Details #2::    Advised pt, script called to [facility].  Added to emr.             Lowella Petties CMA, AAMA  September 22, 2010 8:43 AM   New/Updated Medications: ALPRAZOLAM 0.25 MG TABS (ALPRAZOLAM) take one up to 3 times a day as needed for anxiety  Prior Medications: SERTRALINE HCL 100 MG  TABS (SERTRALINE HCL) take one  by mouth once daily MECLIZINE HCL 25 MG  TABS (MECLIZINE HCL) 1 by mouth three times a day as needed dizziness RANITIDINE HCL 150 MG  TABS (RANITIDINE HCL) take one by mouth two times a day ZOCOR 40 MG  TABS (SIMVASTATIN) 1 once daily for cholesterol LEVOTHROID 100 MCG TABS (LEVOTHYROXINE SODIUM)  take 1 daily CALCIUM CARBONATE-VITAMIN D 600-400 MG-UNIT  TABS (CALCIUM CARBONATE-VITAMIN D) OTC As directed. MULTIVITAMINS   TABS (MULTIPLE VITAMIN) one daily FISH OIL   OIL (FISH OIL) takes three daily HYDROCODONE-ACETAMINOPHEN 5-500 MG TABS (HYDROCODONE-ACETAMINOPHEN) 1 tab ever six hours as needed for pain. AMBIEN 5 MG TABS (ZOLPIDEM TARTRATE) 1 tab by mouth at bedtime as needed insomnia ALPRAZOLAM 0.25 MG TABS (ALPRAZOLAM) take one up to 3 times a day as needed for anxiety Current Allergies: ! * VANCENASE ! * ZYRTEC D ! * BEXTRA ! FOSAMAX ! * ACTONEL ! VYTORIN ! * MIACALCIN NS ! PRAVACHOL

## 2010-11-20 NOTE — Letter (Signed)
Summary: [facility]  [facility]   Imported By: Lanelle Bal 02/03/2010 13:23:01  _____________________________________________________________________  External Attachment:    Type:   Image     Comment:   External Document

## 2011-01-14 ENCOUNTER — Other Ambulatory Visit: Payer: Self-pay | Admitting: Family Medicine

## 2011-01-14 DIAGNOSIS — Z0000 Encounter for general adult medical examination without abnormal findings: Secondary | ICD-10-CM

## 2011-01-14 DIAGNOSIS — Z136 Encounter for screening for cardiovascular disorders: Secondary | ICD-10-CM

## 2011-01-14 DIAGNOSIS — E039 Hypothyroidism, unspecified: Secondary | ICD-10-CM

## 2011-01-19 ENCOUNTER — Other Ambulatory Visit (INDEPENDENT_AMBULATORY_CARE_PROVIDER_SITE_OTHER): Payer: Medicare Other | Admitting: Family Medicine

## 2011-01-19 DIAGNOSIS — Z136 Encounter for screening for cardiovascular disorders: Secondary | ICD-10-CM

## 2011-01-19 DIAGNOSIS — Z0000 Encounter for general adult medical examination without abnormal findings: Secondary | ICD-10-CM

## 2011-01-19 DIAGNOSIS — E039 Hypothyroidism, unspecified: Secondary | ICD-10-CM

## 2011-01-20 LAB — LIPID PANEL
Cholesterol: 292 mg/dL — ABNORMAL HIGH (ref 0–200)
HDL: 45.4 mg/dL (ref >39.00)
Total CHOL/HDL Ratio: 6
VLDL: 27.4 mg/dL (ref 0.0–40.0)

## 2011-01-20 LAB — BASIC METABOLIC PANEL
BUN: 15 mg/dL (ref 6–23)
CO2: 28 mEq/L (ref 19–32)
Chloride: 103 mEq/L (ref 96–112)
Creatinine, Ser: 1.5 mg/dL — ABNORMAL HIGH (ref 0.4–1.2)
Potassium: 4.5 mEq/L (ref 3.5–5.1)

## 2011-01-20 LAB — TSH: TSH: 10.78 u[IU]/mL — ABNORMAL HIGH (ref 0.35–5.50)

## 2011-01-21 ENCOUNTER — Other Ambulatory Visit: Payer: Self-pay | Admitting: *Deleted

## 2011-01-21 MED ORDER — LEVOTHYROXINE SODIUM 112 MCG PO TABS: 112.0000 ug | ORAL_TABLET | Freq: Every day | ORAL | Status: DC

## 2011-01-25 ENCOUNTER — Encounter: Payer: Self-pay | Admitting: Family Medicine

## 2011-01-25 ENCOUNTER — Other Ambulatory Visit: Payer: Self-pay | Admitting: *Deleted

## 2011-01-25 MED ORDER — NABUMETONE 500 MG PO TABS: 500.0000 mg | ORAL_TABLET | Freq: Two times a day (BID) | ORAL | Status: DC

## 2011-02-01 ENCOUNTER — Encounter: Payer: Self-pay | Admitting: Family Medicine

## 2011-02-05 ENCOUNTER — Other Ambulatory Visit (HOSPITAL_COMMUNITY)
Admission: RE | Admit: 2011-02-05 | Discharge: 2011-02-05 | Disposition: A | Discharge status: Home / Self Care | Payer: Medicare Other | Source: Ambulatory Visit | Attending: Family Medicine | Admitting: Family Medicine

## 2011-02-05 ENCOUNTER — Ambulatory Visit (INDEPENDENT_AMBULATORY_CARE_PROVIDER_SITE_OTHER): Payer: Medicare Other | Admitting: Family Medicine

## 2011-02-05 ENCOUNTER — Encounter: Payer: Self-pay | Admitting: Family Medicine

## 2011-02-05 VITALS — BP 120/80 | HR 68 | Temp 98.4°F | Ht 62.0 in | Wt 147.1 lb

## 2011-02-05 DIAGNOSIS — F3289 Other specified depressive episodes: Secondary | ICD-10-CM

## 2011-02-05 DIAGNOSIS — Z0000 Encounter for general adult medical examination without abnormal findings: Secondary | ICD-10-CM

## 2011-02-05 DIAGNOSIS — E039 Hypothyroidism, unspecified: Secondary | ICD-10-CM

## 2011-02-05 DIAGNOSIS — Z1211 Encounter for screening for malignant neoplasm of colon: Secondary | ICD-10-CM

## 2011-02-05 DIAGNOSIS — Z1159 Encounter for screening for other viral diseases: Secondary | ICD-10-CM | POA: Insufficient documentation

## 2011-02-05 DIAGNOSIS — Z01419 Encounter for gynecological examination (general) (routine) without abnormal findings: Secondary | ICD-10-CM | POA: Insufficient documentation

## 2011-02-05 DIAGNOSIS — F329 Major depressive disorder, single episode, unspecified: Secondary | ICD-10-CM

## 2011-02-05 DIAGNOSIS — E782 Mixed hyperlipidemia: Secondary | ICD-10-CM | POA: Insufficient documentation

## 2011-02-05 DIAGNOSIS — E785 Hyperlipidemia, unspecified: Secondary | ICD-10-CM | POA: Insufficient documentation

## 2011-02-05 MED ORDER — ALPRAZOLAM 0.25 MG PO TABS: 0.2500 mg | ORAL_TABLET | Freq: Three times a day (TID) | ORAL | Status: DC | PRN

## 2011-02-05 MED ORDER — SERTRALINE HCL 100 MG PO TABS: 100.0000 mg | ORAL_TABLET | Freq: Every day | ORAL | Status: DC

## 2011-02-05 NOTE — Progress Notes (Signed)
I have personally reviewed the Medicare Annual Wellness questionnaire and have noted 1. The patient's medical and social history 2. Their use of alcohol, tobacco or illicit drugs 3. Their current medications and supplements 4. The patient's functional ability including ADL's, fall risks, home safety risks and hearing or visual             impairment. 5. Diet and physical activities- very physicall active 6. Evidence for depression or mood disorders-none   Hyperlipidemia- Was previously well controlled Zocor 40 mg daily and fish oil.  Cholesterol is now back in 200s, pt admits to not taking it for past several months. No side effects, just thought she did not need it.  Hypothyroidism- TSH was elevated to 10.78.  Increased synthroid to 112 mcg daily last week. Denies symptoms of hypothyroidism.  Already feels better, has more energy.  Less constipation.  The PMH, PSH, Social History, Family History, Medications, and allergies have been reviewed in [facility], and have been updated if relevant.   Review of Systems       See HPI General:  Denies chills and fever. Eyes:  Denies blurring. CV:  Denies chest pain or discomfort. Resp:  Denies shortness of breath.  Physical Exam BP 120/80  Pulse 68  Temp(Src) 98.4 F (36.9 C) (Oral)  Ht 5\' 2"  (1.575 m)  Wt 147 lb 1.9 oz (66.733 kg)  BMI 26.91 kg/m2 General:  Well-developed,well-nourished,in no acute distress; alert,appropriate and cooperative throughout examination Head:  normocephalic and atraumatic.   Eyes:  vision grossly intact, pupils equal, pupils round, and pupils reactive to light.   Ears:  R ear normal and L ear normal.   Nose:  no external deformity.   Mouth:  good dentition.   Neck:  No deformities, masses, or tenderness noted. Breasts:  No mass, nodules, thickening, tenderness, bulging, retraction, inflamation, nipple discharge or skin changes noted.   Lungs:  Normal respiratory effort, chest expands symmetrically. Lungs are  clear to auscultation, no crackles or wheezes. Heart:  Normal rate and regular rhythm. S1 and S2 normal without gallop, murmur, click, rub or other extra sounds. Abdomen:  Bowel sounds positive,abdomen soft and non-tender without masses, organomegaly or hernias noted. Rectal:  no external abnormalities.   Genitalia:  Pelvic Exam:        External: normal female genitalia without lesions or masses        Vagina: normal without lesions or masses        Cervix: normal without lesions or masses        Adnexa: normal bimanual exam without masses or fullness        Uterus: normal by palpation        Pap smear: performed Msk:  No deformity or scoliosis noted of thoracic or lumbar spine.   Extremities:  No clubbing, cyanosis, edema, or deformity noted with normal full range of motion of all joints.   Neurologic:  alert & oriented X3 and gait normal.   Skin:  Intact without suspicious lesions or rashes Cervical Nodes:  No lymphadenopathy noted Axillary Nodes:  No palpable lymphadenopathy Psych:  Cognition and judgment appear intact. Alert and cooperative with normal attention span and concentration. No apparent delusions, illusions, hallucinations

## 2011-02-05 NOTE — Assessment & Plan Note (Signed)
Deteriorated. Restart Zocor, discussed importance of decreasing cholesterol. The patient indicates understanding of these issues and agrees with the plan. Recheck lipid panel and hepatic panel in 8 weeks, future orders entered.

## 2011-02-05 NOTE — Assessment & Plan Note (Signed)
Recently increased Synthroid dose. Recheck TSH in 8 weeks. Future order entered.

## 2011-02-05 NOTE — Patient Instructions (Signed)
Please schedule lab work in 8 weeks.

## 2011-02-05 NOTE — Assessment & Plan Note (Addendum)
The patients weight, height, BMI and visual acuity have been recorded in the chart I have made referrals, counseling and provided education to the patient based review of the above and I have provided the pt with a written personalized care plan for preventive services.  IFOB today. Discussed audiology, she will think about it. Screening pap smear performed today.  Discussed that if this is normal (has not had a pap smear since 2004), than she should no longer be screened for cervical cancer. The patient indicates understanding of these issues and agrees with the plan.

## 2011-02-09 ENCOUNTER — Other Ambulatory Visit: Payer: Medicare Other

## 2011-02-09 ENCOUNTER — Other Ambulatory Visit (INDEPENDENT_AMBULATORY_CARE_PROVIDER_SITE_OTHER): Payer: Medicare Other | Admitting: Family Medicine

## 2011-02-09 DIAGNOSIS — Z1211 Encounter for screening for malignant neoplasm of colon: Secondary | ICD-10-CM

## 2011-02-10 ENCOUNTER — Encounter: Payer: Self-pay | Admitting: *Deleted

## 2011-02-18 NOTE — Progress Notes (Signed)
Addended byMills Koller on: 02/18/2011 11:32 AM   Modules accepted: Orders

## 2011-03-01 ENCOUNTER — Telehealth: Payer: Self-pay | Admitting: *Deleted

## 2011-03-01 NOTE — Telephone Encounter (Signed)
Chart opened in error

## 2011-03-05 NOTE — Op Note (Signed)
Monica Edwards, Monica Edwards                             ACCOUNT NO.:  [id number]   MEDICAL RECORD NO.:  [id number]                   PATIENT TYPE:  OIB   LOCATION:  2899                                 FACILITY:  [facility]   PHYSICIAN:  Thornton Park. Daphine Deutscher, M.D.             DATE OF BIRTH:  1941-04-30   DATE OF PROCEDURE:  DATE OF DISCHARGE:                                 OPERATIVE REPORT   PREOPERATIVE DIAGNOSIS:  Upper abdominal pain and gallstones.   POSTOPERATIVE DIAGNOSIS:  Upper abdominal pain and gallstones.  Multiple  small, black, anthracotic, pigment stones with normal intraoperative  cholangiogram.   OPERATION PERFORMED:  Laparoscopic cholecystectomy with intraoperative  cholangiogram.   SURGEON:  Thornton Park. Daphine Deutscher, M.D.   ASSISTANT:  Leonie Man, M.D.   ANESTHESIA:  General endotracheal.   DESCRIPTION OF PROCEDURE:  Ms. Demers is a 70 year old lady who was taken to  room 17 and given general anesthesia, the abdomen was prepped with Betadine  and draped sterilely.  A longitudinal incision was made down at the  umbilicus and through a small umbilical hernia I opened this larger and  wider to admit the Hasson cannula.  The abdomen was insufflated and then  once insufflation was present, I placed three trocars in the upper abdomen  after injecting these sites with Marcaine.  The gallbladder was grasped and  elevated and I dissected the Calot's triangle using the hook electrocautery  to take down the peritoneal reflections.  There were no real severe  adhesions to the gallbladder.  I circled the cystic duct up on the  gallbladder side and actually was mainly dealing with infundibulum.  I made  an incision in that after putting clips on the artery and on the proximal  duct.  I then milked out several black, anthracotic pigment stones from  where the cholangiocath was going to go and then used the [place] catheter  fixing it with a clip.  Dynamic cholangiogram was obtained which showed  good  filling of the intrahepatic radicals with free flow in the duodenum.  The  cystic duct was triple clipped divided and  made sure the cystic arteries  were clipped and divided and the gallbladder was removed from the  gallbladder bed without entering it.  It was then brought out through the  umbilicus.  We then went back and reinspected the gallbladder bed and no  bleeding or bile leaks were noted.  The patient seemed to tolerate the  procedure well.  I then repaired the umbilical defect with two sutures of 0  Vicryl under laparoscopic direct vision and then watched as I deflated the  abdomen.  No other abnormalities of the  abdominal cavity were noted.  The wounds were closed with 4-0 Vicryl  subcutaneously and with the Tyco dermal adhesive on the skin.  The patient  seemed to tolerate the procedure well and was taken to the  recovery room in  satisfactory condition.                                               Thornton Park Daphine Deutscher, M.D.    MBM/MEDQ  D:  06/15/2004  T:  06/15/2004  Job:  [id number]   cc:   Marne A. Milinda Antis, M.D. [facility]   Stefani Dama, M.D.  [address]  [place]  [place] [address]  Fax: [phone]

## 2011-03-16 ENCOUNTER — Telehealth: Payer: Self-pay | Admitting: *Deleted

## 2011-03-16 NOTE — Telephone Encounter (Signed)
Pt states she has had low back pain for about 4 months, which she says you are aware of, and she would like to come in tomorrow for a back x-ray.  She's coming in the morning for labs and wants to have it done then.

## 2011-03-17 ENCOUNTER — Other Ambulatory Visit (INDEPENDENT_AMBULATORY_CARE_PROVIDER_SITE_OTHER): Payer: Medicare Other | Admitting: Family Medicine

## 2011-03-17 DIAGNOSIS — E039 Hypothyroidism, unspecified: Secondary | ICD-10-CM

## 2011-03-17 DIAGNOSIS — E785 Hyperlipidemia, unspecified: Secondary | ICD-10-CM

## 2011-03-17 LAB — LIPID PANEL
Total CHOL/HDL Ratio: 4
Triglycerides: 205 mg/dL — ABNORMAL HIGH (ref 0.0–149.0)

## 2011-03-17 LAB — HEPATIC FUNCTION PANEL
AST: 23 U/L (ref 0–37)
Total Bilirubin: 0.9 mg/dL (ref 0.3–1.2)

## 2011-03-17 LAB — LDL CHOLESTEROL, DIRECT: Direct LDL: 127.6 mg/dL

## 2011-03-17 NOTE — Telephone Encounter (Signed)
Yes this is ok if we have the staffing for xray today.

## 2011-03-22 ENCOUNTER — Other Ambulatory Visit: Payer: Self-pay

## 2011-03-22 ENCOUNTER — Other Ambulatory Visit: Payer: Self-pay | Admitting: Family Medicine

## 2011-03-22 DIAGNOSIS — M549 Dorsalgia, unspecified: Secondary | ICD-10-CM

## 2011-03-22 NOTE — Telephone Encounter (Signed)
Xray scheduled for tomorrow.

## 2011-03-23 ENCOUNTER — Ambulatory Visit (INDEPENDENT_AMBULATORY_CARE_PROVIDER_SITE_OTHER)
Admission: RE | Admit: 2011-03-23 | Discharge: 2011-03-23 | Disposition: A | Discharge status: Home / Self Care | Payer: Medicare Other | Source: Ambulatory Visit | Attending: Family Medicine | Admitting: Family Medicine

## 2011-03-23 DIAGNOSIS — M549 Dorsalgia, unspecified: Secondary | ICD-10-CM

## 2011-03-23 NOTE — Progress Notes (Signed)
Addended by: Alvina Chou on: 03/23/2011 11:24 AM   Modules accepted: Orders

## 2011-04-01 ENCOUNTER — Other Ambulatory Visit: Payer: Medicare Other

## 2011-04-16 ENCOUNTER — Other Ambulatory Visit: Payer: Self-pay | Admitting: *Deleted

## 2011-04-16 MED ORDER — ALPRAZOLAM 0.25 MG PO TABS: 0.2500 mg | ORAL_TABLET | Freq: Three times a day (TID) | ORAL | Status: DC | PRN

## 2011-04-23 NOTE — Telephone Encounter (Signed)
Rx called to [facility]. 

## 2011-05-06 ENCOUNTER — Other Ambulatory Visit: Payer: Self-pay | Admitting: *Deleted

## 2011-05-06 MED ORDER — NABUMETONE 500 MG PO TABS: 500.0000 mg | ORAL_TABLET | Freq: Two times a day (BID) | ORAL | Status: DC

## 2011-05-10 ENCOUNTER — Telehealth: Payer: Self-pay | Admitting: *Deleted

## 2011-05-10 NOTE — Telephone Encounter (Signed)
Agreed -

## 2011-05-10 NOTE — Telephone Encounter (Signed)
Pt asked if her neurosurgeon can review her back x-rays and tell her what he thinks of them.  I told her that if he has access to epic he can.  I advised her to call that office and ask them.

## 2011-05-17 ENCOUNTER — Telehealth: Payer: Self-pay | Admitting: Family Medicine

## 2011-05-17 MED ORDER — SIMVASTATIN 40 MG PO TABS: 40.0000 mg | ORAL_TABLET | Freq: Every day | ORAL | Status: DC

## 2011-05-17 NOTE — Telephone Encounter (Signed)
Pt called, need Rx called into [facility] for Simvastation 40mg . Pts call back # [phone]..Apolinar Junes

## 2011-06-03 ENCOUNTER — Encounter: Payer: Self-pay | Admitting: Family Medicine

## 2011-06-03 ENCOUNTER — Ambulatory Visit (INDEPENDENT_AMBULATORY_CARE_PROVIDER_SITE_OTHER): Payer: Medicare Other | Admitting: Family Medicine

## 2011-06-03 DIAGNOSIS — R300 Dysuria: Secondary | ICD-10-CM

## 2011-06-03 DIAGNOSIS — R079 Chest pain, unspecified: Secondary | ICD-10-CM

## 2011-06-03 LAB — POCT URINALYSIS DIPSTICK
Blood, UA: NEGATIVE
Leukocytes, UA: NEGATIVE
Nitrite, UA: NEGATIVE
Protein, UA: NEGATIVE

## 2011-06-03 NOTE — Progress Notes (Signed)
Subjective:    Patient ID: Monica Edwards, female    DOB: 23-Jul-1941, 70 y.o.   MRN: [id number]  HPI  Chest Pain Monica Edwards complains of chest pain. Onset was 2 weeks ago.  Says it feels like she swallowed an apple and it got stuck.   Symptoms have unchanged since that time. The patient's pain is intermittent. The patient describes the pain as pressure and does not radiate. Patient rates pain as a 2/10 in intensity. Associated symptoms are: none. Aggravating factors are: none. Alleviating factors are: antacids. Patient's cardiac risk factors are: dyslipidemia.   Also having strong smelling urine.  No dysuria, fevers, chills, nausea or vomiting.  Patient Active Problem List  Diagnoses  . HYPOTHYROIDISM  . DEPRESSION  . CERUMEN IMPACTION, LEFT  . VENOUS INSUFFICIENCY  . POSTNASAL DRIP SYNDROME  . ALLERGIC RHINITIS  . GERD  . RENAL INSUFFICIENCY, ACUTE  . HIP PAIN, LEFT  . SPONDYLOSIS, CERVICAL  . NECK PAIN  . LOW BACK PAIN, CHRONIC  . OSTEOPOROSIS  . FATIGUE  . CAROTID BRUIT  . CHEST PAIN  . NAUSEA AND VOMITING  . OTHER NONSPECIFIC ABNORMAL SERUM ENZYME LEVELS  . MITRAL VALVE PROLAPSE, HX OF  . HEMATURIA, HX OF  . Hyperlipidemia  . Routine general medical examination at a health care facility   Past Medical History  Diagnosis Date  . Allergy   . Depression   . Thyroid disease   . Osteoporosis 2006   Past Surgical History  Procedure Date  . Brain surgery angionoma 1988  . Cholecystectomy 06/15/2004  . Spondylosis 08/26/2003    L-S    No family history on file. Allergies  Allergen Reactions  . Alendronate Sodium     REACTION: intolerant  . Ezetimibe-Simvastatin     REACTION: side effects  . Pravastatin Sodium     REACTION: intolerant  . Risedronate Sodium     REACTION: intolerant   Current Outpatient Prescriptions on File Prior to Visit  Medication Sig Dispense Refill  . ALPRAZolam (XANAX) 0.25 MG tablet Take 1 tablet (0.25 mg total) by mouth 3 (three)  times daily as needed.  90 tablet  0  . Calcium Carbonate-Vitamin D (TH CALCIUM CARBONATE-VITAMIN D) 600-400 MG-UNIT per chew tablet Chew 1 tablet by mouth daily.        . fish oil-omega-3 fatty acids 1000 MG capsule Take 1 g by mouth 3 (three) times daily.        Marland Kitchen levothyroxine (SYNTHROID) 112 MCG tablet Take 1 tablet (112 mcg total) by mouth daily.  30 tablet  3  . Multiple Vitamin (MULTIVITAMIN PO) Take 1 tablet by mouth daily.        . nabumetone (RELAFEN) 500 MG tablet Take 1 tablet (500 mg total) by mouth 2 (two) times daily.  60 tablet  1  . ranitidine (ZANTAC) 150 MG tablet Take 150 mg by mouth 2 (two) times daily.        . sertraline (ZOLOFT) 100 MG tablet Take 1 tablet (100 mg total) by mouth daily.  90 tablet  3  . simvastatin (ZOCOR) 40 MG tablet Take 1 tablet (40 mg total) by mouth at bedtime.  30 tablet  11  . HYDROcodone-acetaminophen (VICODIN) 5-500 MG per tablet Take 1 tablet by mouth every 6 (six) hours as needed.        . meclizine (ANTIVERT) 25 MG tablet Take 25 mg by mouth 3 (three) times daily as needed.        Marland Kitchen  zolpidem (AMBIEN) 5 MG tablet Take 5 mg by mouth at bedtime as needed.         The PMH, PSH, Social History, Family History, Medications, and allergies have been reviewed in [facility], and have been updated if relevant.    Review of Systems See HPI    Objective:   Physical Exam BP 124/78  Pulse 76  Temp(Src) 98.6 F (37 C) (Oral)  Ht 5\' 2"  (1.575 m)  Wt 149 lb (67.586 kg)  BMI 27.25 kg/m2 General:  Well-developed,well-nourished,in no acute distress; alert,appropriate and cooperative throughout examination Head:  normocephalic and atraumatic.   Eyes:  vision grossly intact, pupils equal, pupils round, and pupils reactive to light.   Ears:  R ear normal and L ear normal.   Nose:  no external deformity.   Mouth:  good dentition.   Lungs:  Normal respiratory effort, chest expands symmetrically. Lungs are clear to auscultation, no crackles or wheezes. Heart:   Normal rate and regular rhythm. S1 and S2 normal without gallop, murmur, click, rub or other extra sounds. Abdomen:  Bowel sounds positive,abdomen soft and non-tender without masses, organomegaly or hernias noted. Msk:  No deformity or scoliosis noted of thoracic or lumbar spine.   Extremities:  No clubbing, cyanosis, edema, or deformity noted with normal full range of motion of all joints.   Neurologic:  alert & oriented X3 and gait normal.   Skin:  Intact without suspicious lesions or rashes Cervical Nodes:  No lymphadenopathy noted Axillary Nodes:  No palpable lymphadenopathy Psych:  Cognition and judgment appear intact. Alert and cooperative with normal attention span and concentration. No apparent delusions, illusions, hallucinations       Assessment & Plan:  Chest Pain Likely related to GERD, does not appear cardiac. Advised adding Prilosec daily, continue Zantac. If symptoms progress, she will call us or go to ED. The patient indicates understanding of these issues and agrees with the plan.

## 2011-06-03 NOTE — Progress Notes (Signed)
Addended by: Consuello Masse on: 06/03/2011 03:40 PM   Modules accepted: Orders

## 2011-06-14 ENCOUNTER — Other Ambulatory Visit: Payer: Self-pay | Admitting: *Deleted

## 2011-06-14 MED ORDER — LEVOTHYROXINE SODIUM 112 MCG PO TABS: 112.0000 ug | ORAL_TABLET | Freq: Every day | ORAL | Status: DC

## 2011-06-23 ENCOUNTER — Other Ambulatory Visit: Payer: Self-pay | Admitting: *Deleted

## 2011-06-23 MED ORDER — RANITIDINE HCL 150 MG PO TABS: 150.0000 mg | ORAL_TABLET | Freq: Two times a day (BID) | ORAL | Status: DC

## 2011-07-05 ENCOUNTER — Other Ambulatory Visit: Payer: Self-pay | Admitting: *Deleted

## 2011-07-05 NOTE — Telephone Encounter (Signed)
Okay to refill? 

## 2011-07-06 MED ORDER — NABUMETONE 500 MG PO TABS: 500.0000 mg | ORAL_TABLET | Freq: Two times a day (BID) | ORAL | Status: DC

## 2011-07-06 NOTE — Telephone Encounter (Signed)
Rx called in as directed.   

## 2011-07-13 ENCOUNTER — Other Ambulatory Visit: Payer: Self-pay | Admitting: *Deleted

## 2011-07-13 MED ORDER — ALPRAZOLAM 0.25 MG PO TABS: 0.2500 mg | ORAL_TABLET | Freq: Three times a day (TID) | ORAL | Status: DC | PRN

## 2011-07-13 NOTE — Telephone Encounter (Signed)
Last refill 04/23/2011.

## 2011-07-13 NOTE — Telephone Encounter (Signed)
Rx called to [facility]. 

## 2011-07-19 ENCOUNTER — Ambulatory Visit: Payer: Medicare Other | Admitting: Family Medicine

## 2011-07-21 ENCOUNTER — Ambulatory Visit (INDEPENDENT_AMBULATORY_CARE_PROVIDER_SITE_OTHER): Payer: Medicare Other | Admitting: Family Medicine

## 2011-07-21 ENCOUNTER — Encounter: Payer: Self-pay | Admitting: Family Medicine

## 2011-07-21 ENCOUNTER — Other Ambulatory Visit (HOSPITAL_COMMUNITY)
Admission: RE | Admit: 2011-07-21 | Discharge: 2011-07-21 | Disposition: A | Discharge status: Home / Self Care | Payer: Medicare Other | Source: Ambulatory Visit | Attending: Family Medicine | Admitting: Family Medicine

## 2011-07-21 VITALS — BP 120/70 | HR 71 | Temp 98.2°F | Ht 62.0 in | Wt 147.5 lb

## 2011-07-21 DIAGNOSIS — Z01419 Encounter for gynecological examination (general) (routine) without abnormal findings: Secondary | ICD-10-CM | POA: Insufficient documentation

## 2011-07-21 DIAGNOSIS — Z124 Encounter for screening for malignant neoplasm of cervix: Secondary | ICD-10-CM

## 2011-07-21 DIAGNOSIS — Z1159 Encounter for screening for other viral diseases: Secondary | ICD-10-CM | POA: Insufficient documentation

## 2011-07-21 NOTE — Progress Notes (Signed)
  Subjective:    Patient ID: Monica Edwards, female    DOB: [birth year], 70 y.o.   MRN: [id number]  HPI  Here for repeated pap. Last pap unable to read due to foreign material, likely lubricant.   Review of Systems     Objective:   Physical Exam BP 120/70  Pulse 71  Temp(Src) 98.2 F (36.8 C) (Oral)  Ht 5\' 2"  (1.575 m)  Wt 147 lb 8 oz (66.906 kg)  BMI 26.98 kg/m2  General:  Well-developed,well-nourished,in no acute distress; alert,appropriate and cooperative throughout examination Abdomen:  Bowel sounds positive,abdomen soft and non-tender without masses, organomegaly or hernias noted. Rectal:  no external abnormalities.   Genitalia:  Pelvic Exam:        External: normal female genitalia without lesions or masses        Vagina: normal without lesions or masses        Cervix: normal without lesions or masses        Adnexa: normal bimanual exam without masses or fullness        Uterus: normal by palpation        Pap smear: performed Neurologic:  alert & oriented X3 and gait normal.   Skin:  Intact without suspicious lesions or rashes Cervical Nodes:  No lymphadenopathy noted Axillary Nodes:  No palpable lymphadenopathy Psych:  Cognition and judgment appear intact. Alert and cooperative with normal attention span and concentration. No apparent delusions, illusions, hallucinations        Assessment & Plan:   1. Gynecologic exam normal    Pap performed today, no charge to pt.

## 2011-07-27 ENCOUNTER — Encounter: Payer: Self-pay | Admitting: *Deleted

## 2011-08-10 ENCOUNTER — Other Ambulatory Visit: Payer: Self-pay | Admitting: *Deleted

## 2011-08-10 NOTE — Telephone Encounter (Signed)
Chart opened in error

## 2011-08-11 ENCOUNTER — Other Ambulatory Visit: Payer: Self-pay | Admitting: *Deleted

## 2011-08-11 ENCOUNTER — Encounter: Payer: Self-pay | Admitting: Family Medicine

## 2011-08-11 ENCOUNTER — Ambulatory Visit (INDEPENDENT_AMBULATORY_CARE_PROVIDER_SITE_OTHER): Payer: Medicare Other | Admitting: Family Medicine

## 2011-08-11 ENCOUNTER — Ambulatory Visit (INDEPENDENT_AMBULATORY_CARE_PROVIDER_SITE_OTHER)
Admission: RE | Admit: 2011-08-11 | Discharge: 2011-08-11 | Disposition: A | Discharge status: Home / Self Care | Payer: Medicare Other | Source: Ambulatory Visit | Attending: Family Medicine | Admitting: Family Medicine

## 2011-08-11 VITALS — BP 130/70 | HR 54 | Temp 97.8°F | Ht 62.0 in | Wt 146.5 lb

## 2011-08-11 DIAGNOSIS — K219 Gastro-esophageal reflux disease without esophagitis: Secondary | ICD-10-CM

## 2011-08-11 DIAGNOSIS — R079 Chest pain, unspecified: Secondary | ICD-10-CM

## 2011-08-11 MED ORDER — ZOLPIDEM TARTRATE 5 MG PO TABS: 5.0000 mg | ORAL_TABLET | Freq: Every evening | ORAL | Status: DC | PRN

## 2011-08-11 NOTE — Progress Notes (Signed)
Subjective:    Patient ID: Monica Edwards, female    DOB: [birth year], 70 y.o.   MRN: [id number]  HPI  70 yo here for persistent left sided chest pain.  Saw her in 05/2011 for similar complaint- epigastric/left sided pain that sometimes radiates to back but not always. Was associated with burning in her throat, "acid feeling." That has resolved since she has been taking Pepcid and Zantac. However, still having this intermittent pain.  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  Chest pain is never extertional and does not seem to be worsened or improved by food. No nausea or vomiting. No SOB.  EKG- NSR in 05/2011.  Patient Active Problem List  Diagnoses  . HYPOTHYROIDISM  . DEPRESSION  . CERUMEN IMPACTION, LEFT  . VENOUS INSUFFICIENCY  . POSTNASAL DRIP SYNDROME  . ALLERGIC RHINITIS  . GERD  . RENAL INSUFFICIENCY, ACUTE  . HIP PAIN, LEFT  . SPONDYLOSIS, CERVICAL  . NECK PAIN  . LOW BACK PAIN, CHRONIC  . OSTEOPOROSIS  . FATIGUE  . CAROTID BRUIT  . CHEST PAIN  . NAUSEA AND VOMITING  . OTHER NONSPECIFIC ABNORMAL SERUM ENZYME LEVELS  . MITRAL VALVE PROLAPSE, HX OF  . HEMATURIA, HX OF  . Hyperlipidemia  . Routine general medical examination at a health care facility  . Gynecologic exam normal   Past Medical History  Diagnosis Date  . Allergy   . Depression   . Thyroid disease   . Osteoporosis 2006   Past Surgical History  Procedure Date  . Brain surgery angionoma 1988  . Cholecystectomy 06/15/2004  . Spondylosis 08/26/2003    L-S   History  Substance Use Topics  . Smoking status: Never Smoker   . Smokeless tobacco: Not on file  . Alcohol Use: Not on file   No family history on file. Allergies  Allergen Reactions  . Alendronate Sodium     REACTION: intolerant  . Ezetimibe-Simvastatin     REACTION: side effects  . Pravastatin Sodium     REACTION: intolerant  . Risedronate Sodium    REACTION: intolerant   Current Outpatient Prescriptions on File Prior to Visit  Medication Sig Dispense Refill  . ALPRAZolam (XANAX) 0.25 MG tablet Take 1 tablet (0.25 mg total) by mouth 3 (three) times daily as needed.  90 tablet  0  . Biotin 5000 MCG CAPS Take 1 capsule by mouth daily.        . Calcium Carbonate-Vitamin D (TH CALCIUM CARBONATE-VITAMIN D) 600-400 MG-UNIT per chew tablet Chew 1 tablet by mouth daily.        . fish oil-omega-3 fatty acids 1000 MG capsule Take 1 g by mouth 3 (three) times daily.        Marland Kitchen HYDROcodone-acetaminophen (VICODIN) 5-500 MG per tablet Take 1 tablet by mouth every 6 (six) hours as needed.        Marland Kitchen levothyroxine (SYNTHROID) 112 MCG tablet Take 1 tablet (112 mcg total) by mouth daily.  30 tablet  6  . meclizine (ANTIVERT) 25 MG tablet Take 25 mg by mouth 3 (three) times daily as needed.        . Multiple Vitamin (MULTIVITAMIN PO) Take 1 tablet by mouth daily.        . nabumetone (RELAFEN) 500 MG tablet Take 1 tablet (500 mg total) by mouth 2 (two) times daily.  60 tablet  1  . ranitidine (ZANTAC) 150 MG tablet Take  1 tablet (150 mg total) by mouth 2 (two) times daily.  180 tablet  3  . sertraline (ZOLOFT) 100 MG tablet Take 1 tablet (100 mg total) by mouth daily.  90 tablet  3  . simvastatin (ZOCOR) 40 MG tablet Take 1 tablet (40 mg total) by mouth at bedtime.  30 tablet  11  . zolpidem (AMBIEN) 5 MG tablet Take 5 mg by mouth at bedtime as needed.         The PMH, PSH, Social History, Family History, Medications, and allergies have been reviewed in [facility], and have been updated if relevant.   Review of Systems    See HPI Patient reports no  vision/ hearing changes,anorexia, weight change, fever ,adenopathy, persistant / recurrent hoarseness, swallowing issues, edema,persistant / recurrent cough, hemoptysis, dyspnea(rest, exertional, paroxysmal nocturnal), gastrointestinal  bleeding (melena, rectal bleeding), abdominal pain, GU symptoms(dysuria, hematuria,  pyuria, voiding/incontinence  Issues) syncope, focal weakness, severe memory loss, concerning skin lesions, depression, anxiety, abnormal bruising/bleeding, major joint swelling, breast masses or abnormal vaginal bleeding.    Objective:   Physical Exam BP 130/70  Pulse 54  Temp(Src) 97.8 F (36.6 C) (Oral)  Ht 5\' 2"  (1.575 m)  Wt 146 lb 8 oz (66.452 kg)  BMI 26.80 kg/m2  General:  Well-developed,well-nourished,in no acute distress; alert,appropriate and cooperative throughout examination Head:  normocephalic and atraumatic.   Eyes:  vision grossly intact, pupils equal, pupils round, and pupils reactive to light.   Ears:  R ear normal and L ear normal.   Nose:  no external deformity.   Mouth:  good dentition.   Neck:  No deformities, masses, or tenderness noted. Lungs:  Normal respiratory effort, chest expands symmetrically. Lungs are clear to auscultation, no crackles or wheezes., mild left sided chest wall tenderness with palpation.   Heart:  Normal rate and regular rhythm. S1 and S2 normal without gallop, murmur, click, rub or other extra sounds. Msk:  No deformity or scoliosis noted of thoracic or lumbar spine.   Extremities:  No clubbing, cyanosis, edema, or deformity noted with normal full range of motion of all joints.   Neurologic:  alert & oriented X3 and gait normal.   Skin:  Intact without suspicious lesions or rashes Psych:  Cognition and judgment appear intact. Alert and cooperative with normal attention span and concentration. No apparent delusions, illusions, hallucinations    Assessment & Plan:   1. CHEST PAIN   Unchanged and still not consistent with cardiac etiology.  ? Hiatal hernia vs other GI source as it was improved somewhat with antacids. Will get CXR to evaluate further and refer to GI for work up/endoscopy. DG Chest 2 View  2. GERD   See above.   Ambulatory referral to Gastroenterology

## 2011-08-11 NOTE — Telephone Encounter (Signed)
Rx called to [facility]. 

## 2011-08-11 NOTE — Progress Notes (Signed)
Addended by: Gilmer Mor on: 08/11/2011 11:49 AM   Modules accepted: Orders

## 2011-08-11 NOTE — Patient Instructions (Signed)
Good to see you. Please stop by to see Shirlee Limerick on your way out to set up your stomach doctor referral.

## 2011-08-11 NOTE — Telephone Encounter (Signed)
Patient would like a refill on Ambien.  She stated that sometimes she lays in bed all night and doesn't get any sleep.  Please advise.

## 2011-08-12 ENCOUNTER — Other Ambulatory Visit: Payer: Self-pay | Admitting: Family Medicine

## 2011-08-12 DIAGNOSIS — R079 Chest pain, unspecified: Secondary | ICD-10-CM

## 2011-08-20 ENCOUNTER — Ambulatory Visit (INDEPENDENT_AMBULATORY_CARE_PROVIDER_SITE_OTHER)
Admission: RE | Admit: 2011-08-20 | Discharge: 2011-08-20 | Disposition: A | Discharge status: Home / Self Care | Payer: Medicare Other | Source: Ambulatory Visit | Attending: Family Medicine | Admitting: Family Medicine

## 2011-08-20 DIAGNOSIS — R079 Chest pain, unspecified: Secondary | ICD-10-CM

## 2011-09-13 ENCOUNTER — Other Ambulatory Visit: Payer: Self-pay

## 2011-09-13 MED ORDER — ZOLPIDEM TARTRATE 5 MG PO TABS: 5.0000 mg | ORAL_TABLET | Freq: Every evening | ORAL | Status: DC | PRN

## 2011-09-13 NOTE — Telephone Encounter (Signed)
[facility] faxed refill request Zolpidem 5 mg. Last refilled 08-11-11.Please advise.

## 2011-09-13 NOTE — Telephone Encounter (Signed)
Medication phoned to [facility] pharmacy as instructed.  

## 2011-09-16 ENCOUNTER — Other Ambulatory Visit: Payer: Self-pay | Admitting: Gastroenterology

## 2011-09-24 ENCOUNTER — Ambulatory Visit (INDEPENDENT_AMBULATORY_CARE_PROVIDER_SITE_OTHER): Payer: Medicare Other | Admitting: Family Medicine

## 2011-09-24 ENCOUNTER — Encounter: Payer: Self-pay | Admitting: Family Medicine

## 2011-09-24 VITALS — BP 140/72 | HR 72 | Temp 98.7°F | Ht 62.0 in

## 2011-09-24 DIAGNOSIS — R300 Dysuria: Secondary | ICD-10-CM | POA: Insufficient documentation

## 2011-09-24 LAB — POCT URINALYSIS DIPSTICK
Nitrite, UA: NEGATIVE
Spec Grav, UA: <=1.005
Urobilinogen, UA: 0.2
pH, UA: 7.5

## 2011-09-24 MED ORDER — CIPROFLOXACIN HCL 250 MG PO TABS: 250.0000 mg | ORAL_TABLET | Freq: Two times a day (BID) | ORAL | Status: AC

## 2011-09-24 NOTE — Patient Instructions (Signed)
Urinary Tract Infection Infections of the urinary tract can start in several places. A bladder infection (cystitis), a kidney infection (pyelonephritis), and a prostate infection (prostatitis) are different types of urinary tract infections (UTIs). They usually get better if treated with medicines (antibiotics) that kill germs. Take all the medicine until it is gone. You or your child Bress feel better in a few days, but TAKE ALL MEDICINE or the infection Tosh not respond and Hartel become more difficult to treat. HOME CARE INSTRUCTIONS   Drink enough water and fluids to keep the urine clear or pale yellow. Cranberry juice is especially recommended, in addition to large amounts of water.   Avoid caffeine, tea, and carbonated beverages. They tend to irritate the bladder.   Alcohol Cotrell irritate the prostate.   Only take over-the-counter or prescription medicines for pain, discomfort, or fever as directed by your caregiver.  To prevent further infections:  Empty the bladder often. Avoid holding urine for long periods of time.   After a bowel movement, women should cleanse from front to back. Use each tissue only once.   Empty the bladder before and after sexual intercourse.  FINDING OUT THE RESULTS OF YOUR TEST Not all test results are available during your visit. If your or your child's test results are not back during the visit, make an appointment with your caregiver to find out the results. Do not assume everything is normal if you have not heard from your caregiver or the medical facility. It is important for you to follow up on all test results. SEEK MEDICAL CARE IF:   There is back pain.   Your baby is older than 3 months with a rectal temperature of 100.5 F (38.1 C) or higher for more than 1 day.   Your or your child's problems (symptoms) are no better in 3 days. Return sooner if you or your child is getting worse.  SEEK IMMEDIATE MEDICAL CARE IF:   There is severe back pain or lower  abdominal pain.   You or your child develops chills.   You have a fever.   Your baby is older than 3 months with a rectal temperature of 102 F (38.9 C) or higher.   Your baby is 3 months old or younger with a rectal temperature of 100.4 F (38 C) or higher.   There is nausea or vomiting.   There is continued burning or discomfort with urination.  MAKE SURE YOU:   Understand these instructions.   Will watch your condition.   Will get help right away if you are not doing well or get worse.  Document Released: 07/14/2005 Document Revised: 06/16/2011 Document Reviewed: 02/16/2007 Exit[employer] Patient Information 2012 [employer]. 

## 2011-09-24 NOTE — Progress Notes (Signed)
SUBJECTIVE: Monica Edwards is a 70 y.o. female who complains of urinary frequency, urgency and dysuria x 3 days, without flank pain, fever, chills, or abnormal vaginal discharge or bleeding.   Patient Active Problem List  Diagnoses  . HYPOTHYROIDISM  . DEPRESSION  . CERUMEN IMPACTION, LEFT  . VENOUS INSUFFICIENCY  . POSTNASAL DRIP SYNDROME  . ALLERGIC RHINITIS  . GERD  . RENAL INSUFFICIENCY, ACUTE  . HIP PAIN, LEFT  . SPONDYLOSIS, CERVICAL  . NECK PAIN  . LOW BACK PAIN, CHRONIC  . OSTEOPOROSIS  . FATIGUE  . CAROTID BRUIT  . CHEST PAIN  . NAUSEA AND VOMITING  . OTHER NONSPECIFIC ABNORMAL SERUM ENZYME LEVELS  . MITRAL VALVE PROLAPSE, HX OF  . HEMATURIA, HX OF  . Hyperlipidemia  . Routine general medical examination at a health care facility  . Gynecologic exam normal  . Dysuria   Past Medical History  Diagnosis Date  . Allergy   . Depression   . Thyroid disease   . Osteoporosis 2006   Past Surgical History  Procedure Date  . Brain surgery angionoma 1988  . Cholecystectomy 06/15/2004  . Spondylosis 08/26/2003    L-S   History  Substance Use Topics  . Smoking status: Never Smoker   . Smokeless tobacco: Not on file  . Alcohol Use: Not on file   No family history on file. Allergies  Allergen Reactions  . Alendronate Sodium     REACTION: intolerant  . Ezetimibe-Simvastatin     REACTION: side effects  . Pravastatin Sodium     REACTION: intolerant  . Risedronate Sodium     REACTION: intolerant   Current Outpatient Prescriptions on File Prior to Visit  Medication Sig Dispense Refill  . ALPRAZolam (XANAX) 0.25 MG tablet Take 1 tablet (0.25 mg total) by mouth 3 (three) times daily as needed.  90 tablet  0  . Biotin 5000 MCG CAPS Take 1 capsule by mouth daily.        . Calcium Carbonate-Vit D-Min 600-400 MG-UNIT TABS Take 1 tablet by mouth daily.        . fish oil-omega-3 fatty acids 1000 MG capsule Take 1 g by mouth 3 (three) times daily.        Marland Kitchen levothyroxine  (SYNTHROID) 112 MCG tablet Take 1 tablet (112 mcg total) by mouth daily.  30 tablet  6  . meclizine (ANTIVERT) 25 MG tablet Take 25 mg by mouth 3 (three) times daily as needed.        . Multiple Vitamin (MULTIVITAMIN PO) Take 1 tablet by mouth daily.        . nabumetone (RELAFEN) 500 MG tablet Take 1 tablet (500 mg total) by mouth 2 (two) times daily.  60 tablet  1  . ranitidine (ZANTAC) 150 MG tablet Take 1 tablet (150 mg total) by mouth 2 (two) times daily.  180 tablet  3  . sertraline (ZOLOFT) 100 MG tablet Take 1 tablet (100 mg total) by mouth daily.  90 tablet  3  . simvastatin (ZOCOR) 40 MG tablet Take 1 tablet (40 mg total) by mouth at bedtime.  30 tablet  11  . zolpidem (AMBIEN) 5 MG tablet Take 1 tablet (5 mg total) by mouth at bedtime as needed.  30 tablet  0   The PMH, PSH, Social History, Family History, Medications, and allergies have been reviewed in [facility], and have been updated if relevant.  OBJECTIVE: BP 140/72  Pulse 72  Temp(Src) 98.7 F (37.1 C) (Oral)  Ht 5\' 2"  (1.575 m)   Appears well, in no apparent distress.  Vital signs are normal. The abdomen is soft without tenderness, guarding, mass, rebound or organomegaly. No CVA tenderness or inguinal adenopathy noted. Urine dipstick shows positive for RBC's, positive for protein and positive for leukocytes.   ASSESSMENT: UTI uncomplicated without evidence of pyelonephritis  PLAN: Treatment per orders cipro 250 mg twice daily x 5 days - also push fluids, Doshi use Pyridium OTC prn. Call or return to clinic prn if these symptoms worsen or fail to improve as anticipated.

## 2011-10-05 ENCOUNTER — Other Ambulatory Visit: Payer: Self-pay | Admitting: *Deleted

## 2011-10-05 MED ORDER — ALPRAZOLAM 0.25 MG PO TABS: 0.2500 mg | ORAL_TABLET | Freq: Three times a day (TID) | ORAL | Status: DC | PRN

## 2011-10-05 NOTE — Telephone Encounter (Signed)
Rx called to [facility]. 

## 2011-10-13 ENCOUNTER — Other Ambulatory Visit: Payer: Self-pay | Admitting: *Deleted

## 2011-10-13 MED ORDER — NABUMETONE 500 MG PO TABS: 500.0000 mg | ORAL_TABLET | Freq: Two times a day (BID) | ORAL | Status: DC

## 2011-10-28 ENCOUNTER — Other Ambulatory Visit: Payer: Self-pay | Admitting: *Deleted

## 2011-10-29 MED ORDER — ZOLPIDEM TARTRATE 5 MG PO TABS: 5.0000 mg | ORAL_TABLET | Freq: Every evening | ORAL | Status: DC | PRN

## 2011-10-29 NOTE — Telephone Encounter (Signed)
Rx called to [facility]. 

## 2011-12-06 ENCOUNTER — Other Ambulatory Visit: Payer: Self-pay | Admitting: *Deleted

## 2011-12-06 MED ORDER — ZOLPIDEM TARTRATE 5 MG PO TABS: 5.0000 mg | ORAL_TABLET | Freq: Every evening | ORAL | Status: DC | PRN

## 2011-12-06 NOTE — Telephone Encounter (Signed)
plz phone in. 

## 2011-12-07 NOTE — Telephone Encounter (Signed)
Rx called in as directed.   

## 2011-12-16 ENCOUNTER — Other Ambulatory Visit: Payer: Self-pay | Admitting: *Deleted

## 2011-12-16 MED ORDER — NABUMETONE 500 MG PO TABS: 500.0000 mg | ORAL_TABLET | Freq: Two times a day (BID) | ORAL | Status: DC

## 2011-12-20 ENCOUNTER — Ambulatory Visit (INDEPENDENT_AMBULATORY_CARE_PROVIDER_SITE_OTHER): Payer: Medicare Other | Admitting: Family Medicine

## 2011-12-20 ENCOUNTER — Encounter: Payer: Self-pay | Admitting: Family Medicine

## 2011-12-20 VITALS — BP 124/70 | HR 80 | Temp 98.0°F | Wt 142.0 lb

## 2011-12-20 DIAGNOSIS — M5382 Other specified dorsopathies, cervical region: Secondary | ICD-10-CM

## 2011-12-20 DIAGNOSIS — M489 Spondylopathy, unspecified: Secondary | ICD-10-CM | POA: Insufficient documentation

## 2011-12-20 NOTE — Patient Instructions (Signed)
This is probably coming from the changes/arthritis in your neck. We talked about adding Gabapentin today. You should also call Dr. Danielle Dess to follow up.

## 2011-12-20 NOTE — Progress Notes (Signed)
71 yo here for persistent hand tingling for past several months.  Has known cervical disease.   MRI from 01/2010:  1. 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  2. Severe ligamentous hypertrophy about the dens narrows the  ventral CSF space without mass effect on the cervicomedullary  junction.  3. Chronic spondylolisthesis at C4-C5, stable by report.  4. Multilevel disc and facet degeneration, as detailed above,  results in mild spinal stenosis at C5-C6, and severe neural  foraminal stenosis at C4 on the left, C5 bilaterally, and at C7 on  the left.  5. Postoperative changes to the left cerebellar hemisphere with  chronic hemosiderin deposition in the parenchyma.   Followed by Dr. Danielle Dess Relafen and cervical collar at night do help.  No UE weakness.  Cares for her husband with dementia.  Patient Active Problem List  Diagnoses  . HYPOTHYROIDISM  . DEPRESSION  . CERUMEN IMPACTION, LEFT  . VENOUS INSUFFICIENCY  . POSTNASAL DRIP SYNDROME  . ALLERGIC RHINITIS  . GERD  . RENAL INSUFFICIENCY, ACUTE  . HIP PAIN, LEFT  . SPONDYLOSIS, CERVICAL  . NECK PAIN  . LOW BACK PAIN, CHRONIC  . OSTEOPOROSIS  . FATIGUE  . CAROTID BRUIT  . CHEST PAIN  . NAUSEA AND VOMITING  . OTHER NONSPECIFIC ABNORMAL SERUM ENZYME LEVELS  . MITRAL VALVE PROLAPSE, HX OF  . HEMATURIA, HX OF  . Hyperlipidemia  . Routine general medical examination at a health care facility  . Gynecologic exam normal  . Dysuria  . Cervical spine disease   Past Medical History  Diagnosis Date  . Allergy   . Depression   . Thyroid disease   . Osteoporosis 2006   Past Surgical History  Procedure Date  . Brain surgery angionoma 1988  . Cholecystectomy 06/15/2004  . Spondylosis 08/26/2003    L-S   History  Substance Use Topics  . Smoking status: Never Smoker   . Smokeless tobacco: Not on file  . Alcohol Use: Not on file    No family history on file. Allergies  Allergen Reactions  . Alendronate Sodium     REACTION: intolerant  . Ezetimibe-Simvastatin     REACTION: side effects  . Pravastatin Sodium     REACTION: intolerant  . Risedronate Sodium     REACTION: intolerant   Current Outpatient Prescriptions on File Prior to Visit  Medication Sig Dispense Refill  . ALPRAZolam (XANAX) 0.25 MG tablet Take 1 tablet (0.25 mg total) by mouth 3 (three) times daily as needed.  90 tablet  0  . Biotin 5000 MCG CAPS Take 1 capsule by mouth daily.        . Calcium Carbonate-Vit D-Min 600-400 MG-UNIT TABS Take 1 tablet by mouth daily.        . fish oil-omega-3 fatty acids 1000 MG capsule Take 1 g by mouth 3 (three) times daily.        Marland Kitchen levothyroxine (SYNTHROID) 112 MCG tablet Take 1 tablet (112 mcg total) by mouth daily.  30 tablet  6  . meclizine (ANTIVERT) 25 MG tablet Take 25 mg by mouth 3 (three) times daily as needed.        . Multiple Vitamin (MULTIVITAMIN PO) Take 1 tablet by mouth daily.        . nabumetone (RELAFEN) 500 MG tablet Take 1 tablet (500 mg total) by mouth 2 (two) times daily.  60 tablet  0  . ranitidine (  ZANTAC) 150 MG tablet Take 1 tablet (150 mg total) by mouth 2 (two) times daily.  180 tablet  3  . sertraline (ZOLOFT) 100 MG tablet Take 1 tablet (100 mg total) by mouth daily.  90 tablet  3  . simvastatin (ZOCOR) 40 MG tablet Take 1 tablet (40 mg total) by mouth at bedtime.  30 tablet  11  . zolpidem (AMBIEN) 5 MG tablet Take 1 tablet (5 mg total) by mouth at bedtime as needed.  30 tablet  0    The PMH, PSH, Social History, Family History, Medications, and allergies have been reviewed in [facility], and have been updated if relevant.  ROS:  See HPI Patient reports no syncope, focal weakness, severe memory loss, concerning skin lesions, depression, anxiety, abnormal bruising/bleeding, major joint swelling.  Physical exam: BP 124/70  Pulse 80  Temp(Src) 98 F (36.7 C) (Oral)  Wt 142 lb (64.411  kg) Gen:  Alert, pleasant, NAD MSK:  FROM of neck and back, no TTP over cervical or lumbar spine. Neg arch sign, neg empty can. Neuro:  CNII-XII intact, normal gait.  Assessment and Plan: 1. Cervical spine disease    Most likely deteriorated. Discussed adding Gabapentin but she does not want to start another medication. Advised follow up with Dr. Damien Fusi or further imaging but she would like to defer on both. She will keep me updated with her symptoms.

## 2012-01-05 ENCOUNTER — Other Ambulatory Visit: Payer: Self-pay | Admitting: *Deleted

## 2012-01-05 MED ORDER — ZOLPIDEM TARTRATE 5 MG PO TABS: 5.0000 mg | ORAL_TABLET | Freq: Every evening | ORAL | Status: DC | PRN

## 2012-01-05 NOTE — Telephone Encounter (Signed)
[place]ien [place] to [place]. 

## 2012-01-17 ENCOUNTER — Other Ambulatory Visit: Payer: Self-pay | Admitting: *Deleted

## 2012-01-17 MED ORDER — LEVOTHYROXINE SODIUM 112 MCG PO TABS: 112.0000 ug | ORAL_TABLET | Freq: Every day | ORAL | Status: DC

## 2012-01-20 ENCOUNTER — Encounter: Payer: Self-pay | Admitting: Family Medicine

## 2012-01-20 ENCOUNTER — Ambulatory Visit (INDEPENDENT_AMBULATORY_CARE_PROVIDER_SITE_OTHER): Payer: Medicare Other | Admitting: Family Medicine

## 2012-01-20 ENCOUNTER — Other Ambulatory Visit: Payer: Self-pay | Admitting: *Deleted

## 2012-01-20 VITALS — BP 112/70 | HR 68 | Temp 97.6°F | Wt 141.0 lb

## 2012-01-20 DIAGNOSIS — H6120 Impacted cerumen, unspecified ear: Secondary | ICD-10-CM

## 2012-01-20 MED ORDER — NABUMETONE 500 MG PO TABS: 500.0000 mg | ORAL_TABLET | Freq: Two times a day (BID) | ORAL | Status: DC

## 2012-01-20 NOTE — Patient Instructions (Signed)
Don't use a qtip down in your ear.  Gently try to pop your ears occasionally and let Dr. Dayton Martes know if you have other concerns.

## 2012-01-20 NOTE — Progress Notes (Signed)
L ear pain.  Going on for a week.  Some better today.  Felt like it had fluid in it.  Occ vertigo sensation w/o falls in last week; better today.  H/o cerumen impaction prev.  Her chest felt tighter over the last week but that is also better today.  No cough.  No fevers, vomiting, heartburn.  H/o GERD and on zantac.  No exertional chest pain or SOB.  She was not started on any new meds in the last week.  She's fatigued, likely related to caring for her husband.    Meds, vitals, and allergies reviewed.   ROS: See HPI.  Otherwise, noncontributory.  nad ncat Tm wnl after cerumen in L canal is removed (and she felt better after that) She is able to pop her ears Nasal exam and OP exam wnl rrr ctab

## 2012-01-20 NOTE — Assessment & Plan Note (Signed)
Now resolved, and cerumen removal tolerated well.  She likely had a viral process that was affected her with dizziness and her chest sx.  Lungs clear, no CP now, all sx resolved/resolving and she'll f/u prn.  She agrees.

## 2012-01-20 NOTE — Telephone Encounter (Signed)
Faxed request from [facility], last filled 12/16/11.

## 2012-01-24 ENCOUNTER — Other Ambulatory Visit: Payer: Self-pay | Admitting: *Deleted

## 2012-01-24 NOTE — Telephone Encounter (Signed)
Faxed request from [facility], last filled 10/05/11.

## 2012-01-25 MED ORDER — ALPRAZOLAM 0.25 MG PO TABS: 0.2500 mg | ORAL_TABLET | Freq: Three times a day (TID) | ORAL | Status: DC | PRN

## 2012-01-25 NOTE — Telephone Encounter (Signed)
Medicine called to pharmacy. 

## 2012-02-07 ENCOUNTER — Other Ambulatory Visit: Payer: Self-pay | Admitting: *Deleted

## 2012-02-07 MED ORDER — ZOLPIDEM TARTRATE 5 MG PO TABS: 5.0000 mg | ORAL_TABLET | Freq: Every evening | ORAL | Status: DC | PRN

## 2012-02-07 NOTE — Telephone Encounter (Signed)
Faxed request from [facility], last filled 01/05/12.

## 2012-02-07 NOTE — Telephone Encounter (Signed)
Medicine called to pharmacy. 

## 2012-02-18 ENCOUNTER — Telehealth: Payer: Self-pay

## 2012-02-18 ENCOUNTER — Ambulatory Visit: Payer: Medicare Other | Admitting: Family Medicine

## 2012-02-18 NOTE — Telephone Encounter (Signed)
Pt walked in with red sorethroat for 2 days, coated tongue,non productive cough, pressure feeling in ears. No fever, no wheezing, no difficulty breathing and no congestion. Pt using chloraseptic and gargling with warm salt water. Pt wants to be seen. Dr Para March said he would see now.

## 2012-02-21 ENCOUNTER — Other Ambulatory Visit: Payer: Self-pay | Admitting: *Deleted

## 2012-02-21 MED ORDER — NABUMETONE 500 MG PO TABS: 500.0000 mg | ORAL_TABLET | Freq: Two times a day (BID) | ORAL | Status: DC

## 2012-02-21 MED ORDER — SERTRALINE HCL 100 MG PO TABS: 100.0000 mg | ORAL_TABLET | Freq: Every day | ORAL | Status: DC

## 2012-02-21 NOTE — Telephone Encounter (Signed)
Received faxed refill request from pharmacy. Is it okay to refill medication? 

## 2012-02-23 ENCOUNTER — Encounter: Payer: Self-pay | Admitting: Family Medicine

## 2012-02-23 ENCOUNTER — Ambulatory Visit (INDEPENDENT_AMBULATORY_CARE_PROVIDER_SITE_OTHER): Payer: Medicare Other | Admitting: Family Medicine

## 2012-02-23 VITALS — BP 110/78 | HR 76 | Temp 97.9°F | Wt 142.0 lb

## 2012-02-23 DIAGNOSIS — M79606 Pain in leg, unspecified: Secondary | ICD-10-CM

## 2012-02-23 DIAGNOSIS — M79609 Pain in unspecified limb: Secondary | ICD-10-CM

## 2012-02-23 NOTE — Progress Notes (Signed)
Known neck DDD and has been followed by Dr. Danielle Dess. H/o numb hands, worse at night, better if she lays on her back instead of her side.  I've asked her to talk to Dr. Danielle Dess about this.  This is at baseline.   Her lower back had been bothering for at least a few weeks.  Her back got some better recently.  Now with L>>R posterior thigh pain.  Still on relafen for neck.  The pain is described as constant and deep pain.  No change in symptoms with walking, can bear weight. No injury.  No falls.  She's caring for her husband with dementia, and this continues to be a strain but she isn't lifting/pulling him.    Cold symptoms are better in the interval.    Meds, vitals, and allergies reviewed.   ROS: See HPI.  Otherwise, noncontributory.  nad ncat rrr ctab Back w/o tenderness in midline or paraspinal areas. SLR neg x2 but L tight and tender hamstring  Distally NV intact w/ normal DTRs.

## 2012-02-23 NOTE — Patient Instructions (Addendum)
I think you pulled a muscle.  You can put ice on it for a few minutes at a time.  Go easy with yardwork and don't strain.  This should gradually get better.  Take care.

## 2012-02-24 DIAGNOSIS — M79606 Pain in leg, unspecified: Secondary | ICD-10-CM | POA: Insufficient documentation

## 2012-02-24 NOTE — Assessment & Plan Note (Signed)
Likely hamstring strain, back exam benign and no concern for sciatica based on exam.  No weakness, okay for outpatient f/u.  Should gradually improve.  Relative rest and ice as needed.

## 2012-03-09 ENCOUNTER — Other Ambulatory Visit: Payer: Self-pay | Admitting: *Deleted

## 2012-03-09 MED ORDER — ZOLPIDEM TARTRATE 5 MG PO TABS: 5.0000 mg | ORAL_TABLET | Freq: Every evening | ORAL | Status: DC | PRN

## 2012-03-09 NOTE — Telephone Encounter (Signed)
Faxed refill request from [facility], last filled date 02/07/12.

## 2012-03-10 NOTE — Telephone Encounter (Signed)
Medicine called to pharmacy. 

## 2012-03-17 ENCOUNTER — Other Ambulatory Visit: Payer: Self-pay | Admitting: Family Medicine

## 2012-03-17 DIAGNOSIS — Z1231 Encounter for screening mammogram for malignant neoplasm of breast: Secondary | ICD-10-CM

## 2012-03-22 ENCOUNTER — Other Ambulatory Visit: Payer: Self-pay | Admitting: *Deleted

## 2012-03-22 MED ORDER — ALPRAZOLAM 0.25 MG PO TABS: 0.2500 mg | ORAL_TABLET | Freq: Three times a day (TID) | ORAL | Status: DC | PRN

## 2012-03-22 NOTE — Telephone Encounter (Signed)
Faxed refill request from [facility], last filled # 90 on 01/25/12.

## 2012-03-22 NOTE — Telephone Encounter (Signed)
[place]. 

## 2012-04-05 ENCOUNTER — Ambulatory Visit
Admission: RE | Admit: 2012-04-05 | Discharge: 2012-04-05 | Disposition: A | Discharge status: Home / Self Care | Payer: Medicare Other | Source: Ambulatory Visit | Attending: Family Medicine | Admitting: Family Medicine

## 2012-04-05 DIAGNOSIS — Z1231 Encounter for screening mammogram for malignant neoplasm of breast: Secondary | ICD-10-CM

## 2012-04-06 ENCOUNTER — Encounter: Payer: Self-pay | Admitting: *Deleted

## 2012-04-06 ENCOUNTER — Encounter: Payer: Self-pay | Admitting: Family Medicine

## 2012-04-13 ENCOUNTER — Ambulatory Visit: Payer: Medicare Other | Admitting: Family Medicine

## 2012-04-14 ENCOUNTER — Ambulatory Visit: Payer: Medicare Other | Admitting: Family Medicine

## 2012-04-17 ENCOUNTER — Ambulatory Visit (INDEPENDENT_AMBULATORY_CARE_PROVIDER_SITE_OTHER): Payer: Medicare Other | Admitting: Family Medicine

## 2012-04-17 ENCOUNTER — Encounter: Payer: Self-pay | Admitting: Family Medicine

## 2012-04-17 VITALS — BP 108/66 | HR 70 | Temp 97.7°F | Wt 142.8 lb

## 2012-04-17 DIAGNOSIS — M79606 Pain in leg, unspecified: Secondary | ICD-10-CM

## 2012-04-17 DIAGNOSIS — M79609 Pain in unspecified limb: Secondary | ICD-10-CM

## 2012-04-17 NOTE — Patient Instructions (Addendum)
Stop the simvastatin for now and call back with an update next week.  Let us know if the leg and abdominal pain are any better.  Take care.

## 2012-04-17 NOTE — Progress Notes (Signed)
She was having chest pain and stopped relafen; she was much better immediately (ie that day).   This was discussed and added to intolerant list.  Likely GERD related.   L posterior thigh pain.  She had been working hard at the house we thought this a hamstring strain.  She is still on statin.  She could feel a lumpy knot in the hamstring. No R sided sx.  No trauma, no falls. No weakness in legs.  No fever, abd pain, dysuria.    L sided abd pain, had been noted as far back as 1999.  EGD done 11/12 and unremarkable.  Colonoscopy done 2004.   Meds, vitals, and allergies reviewed.   ROS: See HPI.  Otherwise, noncontributory.  nad ncat rrr ctab abd soft not ttp except for mild LUQ tenderness w/o rebound Ext w/o edema L quad not ttp, L hamstring minimally ttp

## 2012-04-18 NOTE — Assessment & Plan Note (Signed)
Will stop simva. Possible statin related pain. Will have her call back next week.  Consider further w/u for L leg and LUQ pain at that time, if either or both persist.

## 2012-04-21 ENCOUNTER — Telehealth: Payer: Self-pay | Admitting: Family Medicine

## 2012-04-21 NOTE — Telephone Encounter (Signed)
Caller: Dalaysia/Patient; PCP: Crawford Givens Clelia Croft); CB#: [phone];  Call regarding seen in office on 04/17/12  for  pain down inner Leg - onset 04/14/12. Taken off medication for Cholesterol. She is still having some inner leg pain but now is having pain in hip when she get up and sits down. Hip pain came on suddenly today @ 1430 when she sat in the car. No pain if standing or walking. She stopped taking Nubumetone about a week ago when she was getting pain in upper abdomen and chest. Triage and Care advice per Leg Non- Injury Protocol and advised to try taking Alleve q 12 hr to see if helps with hip pain and to call office for appnt on 04/24/12 or call back is sx worsen dispite rest.

## 2012-04-23 NOTE — Telephone Encounter (Signed)
Please call to check on Pt.

## 2012-04-24 ENCOUNTER — Telehealth: Payer: Self-pay

## 2012-04-24 NOTE — Telephone Encounter (Signed)
Opened in error addendum to 04/21/12 note.

## 2012-04-24 NOTE — Telephone Encounter (Signed)
Wonderful, glad pain has improved.

## 2012-04-24 NOTE — Telephone Encounter (Signed)
Advised patient

## 2012-04-24 NOTE — Telephone Encounter (Signed)
Pt called with update; bulge under lt rib cage is still there but no pain(pt said bulge there since 1999).  Upper left leg pain is much better; only occasional pain on and off; pt state "basically pain is gone." [place].Please advise.

## 2012-05-09 ENCOUNTER — Other Ambulatory Visit: Payer: Self-pay | Admitting: *Deleted

## 2012-05-09 MED ORDER — ZOLPIDEM TARTRATE 5 MG PO TABS: 5.0000 mg | ORAL_TABLET | Freq: Every evening | ORAL | Status: DC | PRN

## 2012-05-09 NOTE — Telephone Encounter (Signed)
Faxed refill request from [facility], last filled 30 on 03/10/12.

## 2012-05-10 NOTE — Telephone Encounter (Signed)
Medicine called to pharmacy. 

## 2012-06-06 ENCOUNTER — Other Ambulatory Visit: Payer: Self-pay | Admitting: *Deleted

## 2012-06-06 MED ORDER — ALPRAZOLAM 0.25 MG PO TABS: 0.2500 mg | ORAL_TABLET | Freq: Three times a day (TID) | ORAL | Status: DC | PRN

## 2012-06-06 NOTE — Telephone Encounter (Signed)
Faxed refill request from [facility], last filled 03/22/12.

## 2012-06-06 NOTE — Telephone Encounter (Signed)
[place]. 

## 2012-06-12 ENCOUNTER — Other Ambulatory Visit: Payer: Self-pay | Admitting: *Deleted

## 2012-06-12 MED ORDER — LEVOTHYROXINE SODIUM 112 MCG PO TABS: 112.0000 ug | ORAL_TABLET | Freq: Every day | ORAL | Status: DC

## 2012-06-27 ENCOUNTER — Other Ambulatory Visit: Payer: Self-pay | Admitting: *Deleted

## 2012-06-27 MED ORDER — RANITIDINE HCL 150 MG PO TABS: 150.0000 mg | ORAL_TABLET | Freq: Two times a day (BID) | ORAL | Status: DC

## 2012-08-29 ENCOUNTER — Ambulatory Visit: Payer: Medicare Other | Admitting: Family Medicine

## 2012-09-12 ENCOUNTER — Other Ambulatory Visit: Payer: Self-pay | Admitting: *Deleted

## 2012-09-12 MED ORDER — LEVOTHYROXINE SODIUM 112 MCG PO TABS: 112.0000 ug | ORAL_TABLET | Freq: Every day | ORAL | Status: DC

## 2012-10-16 ENCOUNTER — Other Ambulatory Visit: Payer: Self-pay | Admitting: Family Medicine

## 2012-10-16 NOTE — Telephone Encounter (Signed)
Ok to refill enough to get to appointment, but appointment for CPX needs to be made.

## 2012-10-16 NOTE — Telephone Encounter (Signed)
Pt requesting refill for levothyroxine to last her until she can make an appt.  Past 2 refills have stated that pt needs to be seen prior to any additional refills.  Last physical 08/11/11.  Please advise.

## 2012-10-17 MED ORDER — LEVOTHYROXINE SODIUM 112 MCG PO TABS: 112.0000 ug | ORAL_TABLET | Freq: Every day | ORAL | Status: DC

## 2012-10-17 NOTE — Telephone Encounter (Signed)
Appointment made, refill sent to pharmacy.

## 2012-10-17 NOTE — Telephone Encounter (Signed)
Mailed medicare wellness paperwork for patient to complete.  Asked her to bring this back for her appt.  Pt agreed.

## 2012-11-03 ENCOUNTER — Ambulatory Visit (INDEPENDENT_AMBULATORY_CARE_PROVIDER_SITE_OTHER): Payer: Medicare Other | Admitting: Family Medicine

## 2012-11-03 ENCOUNTER — Ambulatory Visit (INDEPENDENT_AMBULATORY_CARE_PROVIDER_SITE_OTHER)
Admission: RE | Admit: 2012-11-03 | Discharge: 2012-11-03 | Disposition: A | Discharge status: Home / Self Care | Payer: Medicare Other | Source: Ambulatory Visit | Attending: Family Medicine | Admitting: Family Medicine

## 2012-11-03 ENCOUNTER — Encounter: Payer: Self-pay | Admitting: Family Medicine

## 2012-11-03 VITALS — BP 130/80 | HR 60 | Temp 97.5°F | Ht 62.25 in | Wt 143.0 lb

## 2012-11-03 DIAGNOSIS — M545 Low back pain, unspecified: Secondary | ICD-10-CM | POA: Insufficient documentation

## 2012-11-03 DIAGNOSIS — R94120 Abnormal auditory function study: Secondary | ICD-10-CM | POA: Insufficient documentation

## 2012-11-03 DIAGNOSIS — E039 Hypothyroidism, unspecified: Secondary | ICD-10-CM

## 2012-11-03 DIAGNOSIS — E785 Hyperlipidemia, unspecified: Secondary | ICD-10-CM

## 2012-11-03 DIAGNOSIS — H6122 Impacted cerumen, left ear: Secondary | ICD-10-CM | POA: Insufficient documentation

## 2012-11-03 DIAGNOSIS — F3289 Other specified depressive episodes: Secondary | ICD-10-CM

## 2012-11-03 DIAGNOSIS — H6120 Impacted cerumen, unspecified ear: Secondary | ICD-10-CM

## 2012-11-03 DIAGNOSIS — Z0000 Encounter for general adult medical examination without abnormal findings: Secondary | ICD-10-CM

## 2012-11-03 DIAGNOSIS — F329 Major depressive disorder, single episode, unspecified: Secondary | ICD-10-CM

## 2012-11-03 LAB — LIPID PANEL
Cholesterol: 266 mg/dL — ABNORMAL HIGH (ref 0–200)
LDL Cholesterol: 177 mg/dL — ABNORMAL HIGH (ref 0–99)
Triglycerides: 261 mg/dL — ABNORMAL HIGH (ref <150)

## 2012-11-03 LAB — COMPREHENSIVE METABOLIC PANEL
Albumin: 4.2 g/dL (ref 3.5–5.2)
BUN: 16 mg/dL (ref 6–23)
Calcium: 9.1 mg/dL (ref 8.4–10.5)
Chloride: 105 mEq/L (ref 96–112)
Glucose, Bld: 68 mg/dL — ABNORMAL LOW (ref 70–99)
Potassium: 4.3 mEq/L (ref 3.5–5.3)

## 2012-11-03 MED ORDER — SERTRALINE HCL 100 MG PO TABS: 200.0000 mg | ORAL_TABLET | Freq: Every day | ORAL | Status: DC

## 2012-11-03 NOTE — Progress Notes (Signed)
72 yo here for annual medicare wellness visit.  I have personally reviewed the Medicare Annual Wellness questionnaire and have noted 1. The patient's medical and social history 2. Their use of alcohol, tobacco or illicit drugs 3. Their current medications and supplements 4. The patient's functional ability including ADL's, fall risks, home safety risks and hearing or visual             impairment. 5. Diet and physical activities 6. Evidence for depression or mood disorders  End of life wishes discussed and updated in Social History.  Hyperlipidemia-  Lab Results  Component Value Date   CHOL 204* 03/17/2011   HDL 46.80 03/17/2011   LDLCALC 101* 07/31/2009   LDLDIRECT 127.6 03/17/2011   TRIG 205.0* 03/17/2011   CHOLHDL 4 03/17/2011    Was previously well controlled Zocor 40 mg daily and fish oil. Pt admits to not taking it for past year. No side effects, just thought she did not need it.  Hypothyroidism- Has been feeling more fatigued lately.  Has been taking her synthroid.  Otherwise denies any symptoms of hypo or hyperthyroidism. Lab Results  Component Value Date   TSH 1.63 03/17/2011   Depression- on Zoloft 100 mg daily.  Cares for her husband who has Alzheimers.  They do have more help now but she does admit to feeling more depressed.  She is often tearful.  Does not look forward to "waking up to face the day."  Denies any SI or HI.  She does feel Zoloft helps because she feels much worse without.  Low back pain with radiculopathy- ongoing for more than a year.  At times, has aches and numbness down her legs.  Does not feel any numbness or urinary symptoms. Patient Active Problem List  Diagnosis  . HYPOTHYROIDISM  . DEPRESSION  . CERUMEN IMPACTION, LEFT  . VENOUS INSUFFICIENCY  . ALLERGIC RHINITIS  . GERD  . HIP PAIN, LEFT  . SPONDYLOSIS, CERVICAL  . NECK PAIN  . LOW BACK PAIN, CHRONIC  . OSTEOPOROSIS  . FATIGUE  . CAROTID BRUIT  . CHEST PAIN  . NAUSEA AND VOMITING  .  MITRAL VALVE PROLAPSE, HX OF  . HEMATURIA, HX OF  . Hyperlipidemia  . Routine general medical examination at a health care facility  . Cervical spine disease   Past Medical History  Diagnosis Date  . Allergy   . Depression   . Thyroid disease   . Osteoporosis 2006   Past Surgical History  Procedure Date  . Brain surgery angionoma 1988  . Cholecystectomy 06/15/2004  . Spondylosis 08/26/2003    L-S   History  Substance Use Topics  . Smoking status: Never Smoker   . Smokeless tobacco: Not on file  . Alcohol Use: Not on file   No family history on file. Allergies  Allergen Reactions  . Alendronate Sodium     REACTION: intolerant  . Ezetimibe-Simvastatin     REACTION: side effects  . Pravastatin Sodium     REACTION: intolerant  . Relafen (Nabumetone)     Chest pain  . Risedronate Sodium     REACTION: intolerant   Current Outpatient Prescriptions on File Prior to Visit  Medication Sig Dispense Refill  . Biotin 5000 MCG CAPS Take 1 capsule by mouth daily.        . Calcium Carbonate-Vit D-Min 600-400 MG-UNIT TABS Take 1 tablet by mouth daily.        . cholecalciferol (VITAMIN D) 1000 UNITS tablet Take 1,000 Units  by mouth 3 (three) times daily.      . fish oil-omega-3 fatty acids 1000 MG capsule Take 1 g by mouth 3 (three) times daily.        Marland Kitchen levothyroxine (SYNTHROID) 112 MCG tablet Take 1 tablet (112 mcg total) by mouth daily.  30 tablet  0  . meclizine (ANTIVERT) 25 MG tablet Take 25 mg by mouth 3 (three) times daily as needed.      . Multiple Vitamin (MULTIVITAMIN PO) Take 1 tablet by mouth daily.        . ranitidine (ZANTAC) 150 MG tablet Take 1 tablet (150 mg total) by mouth 2 (two) times daily.  180 tablet  1  . sertraline (ZOLOFT) 100 MG tablet Take 1 tablet (100 mg total) by mouth daily.  90 tablet  3  . vitamin E 400 UNIT capsule Take 400 Units by mouth 2 (two) times daily.         The PMH, PSH, Social History, Family History, Medications, and allergies have  been reviewed in [facility], and have been updated if relevant.   Review of Systems       See HPI Patient reports no  vision/ hearing changes,anorexia, weight change, fever ,adenopathy, persistant / recurrent hoarseness, swallowing issues, chest pain, edema,persistant / recurrent cough, hemoptysis, dyspnea(rest, exertional, paroxysmal nocturnal), gastrointestinal  bleeding (melena, rectal bleeding), abdominal pain, excessive heart burn, GU symptoms(dysuria, hematuria, pyuria, voiding/incontinence  Issues) syncope, focal weakness, severe memory loss, concerning skin lesions, abnormal bruising/bleeding, major joint swelling, breast masses or abnormal vaginal bleeding.     Physical Exam BP 130/80  Pulse 60  Temp 97.5 F (36.4 C)  Ht 5' 2.25" (1.581 m)  Wt 143 lb (64.864 kg)  BMI 25.95 kg/m2 General:  Well-developed,well-nourished,in no acute distress; alert,appropriate and cooperative throughout examination Head:  normocephalic and atraumatic.   Eyes:  vision grossly intact, pupils equal, pupils round, and pupils reactive to light.   Ears:  R ear normal Left ear with impacted cerumen  Nose:  no external deformity.   Mouth:  good dentition.   Neck:  No deformities, masses, or tenderness noted. Lungs:  Normal respiratory effort, chest expands symmetrically. Lungs are clear to auscultation, no crackles or wheezes. Heart:  Normal rate and regular rhythm. S1 and S2 normal without gallop, murmur, click, rub or other extra sounds. Abdomen:  Bowel sounds positive,abdomen soft and non-tender without masses, organomegaly or hernias noted. Msk:  No deformity or scoliosis noted of thoracic or lumbar spine.   Extremities:  No clubbing, cyanosis, edema, or deformity noted with normal full range of motion of all joints.   Neurologic:  alert & oriented X3 and gait normal.   Skin:  Intact without suspicious lesions or rashes Psych:  Cognition and judgment appear intact. Alert and cooperative with normal  attention span and concentration. No apparent delusions, illusions, hallucinations 1. HYPOTHYROIDISM  On rx.  Recheck labs today. TSH  2. Hyperlipidemia  Likely deteriorated.  Recheck labs today and discussed restarting her statin.  She will consider this. Lipid panel  3. Routine general medical examination at a health care facility  The patients weight, height, BMI and visual acuity have been recorded in the chart I have made referrals, counseling and provided education to the patient based review of the above and I have provided the pt with a written personalized care plan for preventive services.  Comprehensive metabolic panel  4. 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  Will start with xray of lumbar spine.  No red flags currently. The patient indicates understanding of these issues and agrees with the plan.  DG Lumbar Spine Complete  5. DEPRESSION  Deteriorated. After long discussion with pt and her daughter, she did agree to psychotherapy with Dr. Laymond Purser- referral placed.  Will also increase dose of zoloft and she will follow up with me in a few weeks.   6. Left ear impacted cerumen  She declined ear lavage.  Would prefer to try debrox.   7. Failed hearing screening  Likely related to #6.

## 2012-11-03 NOTE — Patient Instructions (Addendum)
Check with your insurance to see if they will cover the shingles shot.  Try Debrox drop over the counter.  Please stop by to see Shirlee Limerick on your way out to set up your appointment with Dr. Laymond Purser.  We are increasing your zoloft to 200 mg daily- start out taking 150 mg daily for few 3 or 4 days to let your body adjust.    We will call you with your lab and xray results.  Please make an appointment to come see me in the next month or two.

## 2012-11-06 ENCOUNTER — Other Ambulatory Visit: Payer: Self-pay | Admitting: Family Medicine

## 2012-11-06 MED ORDER — SIMVASTATIN 40 MG PO TABS: 40.0000 mg | ORAL_TABLET | Freq: Every day | ORAL | Status: DC

## 2012-11-13 ENCOUNTER — Other Ambulatory Visit: Payer: Self-pay | Admitting: *Deleted

## 2012-11-13 MED ORDER — LEVOTHYROXINE SODIUM 112 MCG PO TABS: ORAL_TABLET | ORAL | Status: DC

## 2012-12-08 ENCOUNTER — Encounter: Payer: Self-pay | Admitting: Family Medicine

## 2012-12-08 ENCOUNTER — Ambulatory Visit (INDEPENDENT_AMBULATORY_CARE_PROVIDER_SITE_OTHER): Payer: Medicare Other | Admitting: Family Medicine

## 2012-12-08 VITALS — BP 124/80 | HR 64 | Temp 98.0°F | Wt 144.0 lb

## 2012-12-08 DIAGNOSIS — E785 Hyperlipidemia, unspecified: Secondary | ICD-10-CM

## 2012-12-08 DIAGNOSIS — H6122 Impacted cerumen, left ear: Secondary | ICD-10-CM

## 2012-12-08 DIAGNOSIS — Z1211 Encounter for screening for malignant neoplasm of colon: Secondary | ICD-10-CM

## 2012-12-08 DIAGNOSIS — F3289 Other specified depressive episodes: Secondary | ICD-10-CM

## 2012-12-08 DIAGNOSIS — H6120 Impacted cerumen, unspecified ear: Secondary | ICD-10-CM

## 2012-12-08 DIAGNOSIS — F329 Major depressive disorder, single episode, unspecified: Secondary | ICD-10-CM

## 2012-12-08 DIAGNOSIS — Z23 Encounter for immunization: Secondary | ICD-10-CM

## 2012-12-08 NOTE — Patient Instructions (Addendum)
Since you are not willing to start cholesterol medication, please work on diet as we discussed. Come back to see me in 8-12 weeks to recheck your cholesterol.  Please stop by the lab to get your stool cards.

## 2012-12-08 NOTE — Progress Notes (Signed)
72 yo very pleasant female here for one month follow up.  Hyperlipidemia-  Lab Results  Component Value Date   CHOL 266* 11/03/2012   HDL 37* 11/03/2012   LDLCALC 177* 11/03/2012   LDLDIRECT 127.6 03/17/2011   TRIG 261* 11/03/2012   CHOLHDL 7.2 11/03/2012    Was previously well controlled Zocor 40 mg daily and fish oil. Pt had admitted to not taking it for past year. No side effects, just thought she did not need it.  She agreed to restart it last month but never did.  She is insisting on trying diet alone.     Depression- on Zoloft, increased to 200 mg daily last month.  She has noticed an improvement in her depressive symptoms.  She is less tearful.  Cares for her husband who has Alzheimers.  They do have more help now but she does admit to feeling more depressed.  I referred her to see Dr. Laymond Purser last month.  She has first appointment to see her next month.  Low back pain with radiculopathy- ongoing for more than a year.  At times, has aches and numbness down her legs.  Does not feel any numbness or urinary symptoms. Lumbar film negative last month and she feels this is improving.  RADIOLOGY REPORT*  Clinical Data: Low back pain, radiculopathy.  LUMBAR SPINE - COMPLETE 4+ VIEW  Comparison: 03/23/2011  Findings: Prior cholecystectomy. There are five lumbar-type  vertebral bodies. No fracture or malalignment. Disc spaces well  maintained. SI joints are symmetric. Degenerative facet disease at  L5-S1, similar to prior study.  IMPRESSION:  No acute findings.  Left cerumen impaction- she attempted to flush out her own cerumen with debrox but was unsuccessful.  Still having a hard time hearing out of left ear and vertigo symptoms have been worse past few weeks. Patient Active Problem List  Diagnosis  . HYPOTHYROIDISM  . DEPRESSION  . VENOUS INSUFFICIENCY  . ALLERGIC RHINITIS  . GERD  . LOW BACK PAIN, CHRONIC  . OSTEOPOROSIS  . CAROTID BRUIT  . MITRAL VALVE PROLAPSE, HX OF  .  HEMATURIA, HX OF  . Hyperlipidemia  . Cervical spine disease   Past Medical History  Diagnosis Date  . Allergy   . Depression   . Thyroid disease   . Osteoporosis 2006   Past Surgical History  Procedure Laterality Date  . Brain surgery angionoma  1988  . Cholecystectomy  06/15/2004  . Spondylosis  08/26/2003    L-S   History  Substance Use Topics  . Smoking status: Never Smoker   . Smokeless tobacco: Not on file  . Alcohol Use: Not on file   No family history on file. Allergies  Allergen Reactions  . Alendronate Sodium     REACTION: intolerant  . Ezetimibe-Simvastatin     REACTION: side effects  . Pravastatin Sodium     REACTION: intolerant  . Relafen (Nabumetone)     Chest pain  . Risedronate Sodium     REACTION: intolerant   Current Outpatient Prescriptions on File Prior to Visit  Medication Sig Dispense Refill  . Biotin 5000 MCG CAPS Take 1 capsule by mouth daily.        . Calcium Carbonate-Vit D-Min 600-400 MG-UNIT TABS Take 1 tablet by mouth daily.        . cholecalciferol (VITAMIN D) 1000 UNITS tablet Take 1,000 Units by mouth 3 (three) times daily.      . fish oil-omega-3 fatty acids 1000 MG capsule Take 1  g by mouth 3 (three) times daily.        Marland Kitchen levothyroxine (SYNTHROID) 112 MCG tablet Take one by mouth daily  30 tablet  5  . meclizine (ANTIVERT) 25 MG tablet Take 25 mg by mouth 3 (three) times daily as needed.      . Multiple Vitamin (MULTIVITAMIN PO) Take 1 tablet by mouth daily.        . ranitidine (ZANTAC) 150 MG tablet Take 1 tablet (150 mg total) by mouth 2 (two) times daily.  180 tablet  1  . sertraline (ZOLOFT) 100 MG tablet Take 2 tablets (200 mg total) by mouth daily.  90 tablet  3  . simvastatin (ZOCOR) 40 MG tablet Take 1 tablet (40 mg total) by mouth at bedtime.  90 tablet  3  . vitamin E 400 UNIT capsule Take 400 Units by mouth 2 (two) times daily.       No current facility-administered medications on file prior to visit.     The PMH,  PSH, Social History, Family History, Medications, and allergies have been reviewed in [facility], and have been updated if relevant.   Review of Systems       See HPI   Physical Exam BP 124/80  Pulse 64  Temp(Src) 98 F (36.7 C)  Wt 144 lb (65.318 kg)  BMI 26.13 kg/m2 General:  Well-developed,well-nourished,in no acute distress; alert,appropriate and cooperative throughout examination Head:  normocephalic and atraumatic.   Eyes:  vision grossly intact, pupils equal, pupils round, and pupils reactive to light.   Ears:  R ear normal Left cerumen impaction Nose:  no external deformity.   Mouth:  good dentition.   Neck:  No deformities, masses, or tenderness noted. Lungs:  Normal respiratory effort, chest expands symmetrically. Lungs are clear to auscultation, no crackles or wheezes. Heart:  Normal rate and regular rhythm. S1 and S2 normal without gallop, murmur, click, rub or other extra sounds. Abdomen:  Bowel sounds positive,abdomen soft and non-tender without masses, organomegaly or hernias noted. Msk:  No deformity or scoliosis noted of thoracic or lumbar spine.   Extremities:  No clubbing, cyanosis, edema, or deformity noted with normal full range of motion of all joints.   Neurologic:  alert & oriented X3 and gait normal.   Skin:  Intact without suspicious lesions or rashes Psych:  Cognition and judgment appear intact. Alert and cooperative with normal attention span and concentration. No apparent delusions, illusions, hallucinations  Assessment and Plan: 1. Special screening for malignant neoplasms, colon IFOB  2. DEPRESSION Improved with increased dose of zoloft.  Advised to keep appt with Dr. Laymond Purser.  3. Hyperlipidemia She is refusing to start medications.  Given handout about low cholesterol diet. Recheck lipids in next 2 - 3 months. The patient indicates understanding of these issues and agrees with the plan.   4. Impacted cerumen of left ear Ceruminosis is noted.  Wax is  removed by syringing and manual debridement. Instructions for home care to prevent wax buildup are given.

## 2012-12-08 NOTE — Addendum Note (Signed)
Addended by: Eliezer Bottom on: 12/08/2012 04:15 PM   Modules accepted: Orders

## 2012-12-12 ENCOUNTER — Other Ambulatory Visit (INDEPENDENT_AMBULATORY_CARE_PROVIDER_SITE_OTHER): Payer: Medicare Other

## 2012-12-12 DIAGNOSIS — Z1211 Encounter for screening for malignant neoplasm of colon: Secondary | ICD-10-CM

## 2012-12-13 ENCOUNTER — Ambulatory Visit (INDEPENDENT_AMBULATORY_CARE_PROVIDER_SITE_OTHER): Payer: 59 | Admitting: Psychology

## 2012-12-13 ENCOUNTER — Encounter: Payer: Self-pay | Admitting: *Deleted

## 2012-12-13 ENCOUNTER — Ambulatory Visit: Payer: Medicare Other | Admitting: Family Medicine

## 2012-12-13 ENCOUNTER — Encounter: Payer: Self-pay | Admitting: Family Medicine

## 2012-12-13 DIAGNOSIS — F4320 Adjustment disorder, unspecified: Secondary | ICD-10-CM

## 2012-12-15 ENCOUNTER — Ambulatory Visit: Payer: Medicare Other | Admitting: Family Medicine

## 2013-01-08 ENCOUNTER — Encounter: Payer: Self-pay | Admitting: Family Medicine

## 2013-01-08 ENCOUNTER — Ambulatory Visit (INDEPENDENT_AMBULATORY_CARE_PROVIDER_SITE_OTHER): Payer: Medicare Other | Admitting: Family Medicine

## 2013-01-08 VITALS — BP 122/70 | HR 68 | Temp 97.4°F

## 2013-01-08 DIAGNOSIS — H9209 Otalgia, unspecified ear: Secondary | ICD-10-CM

## 2013-01-08 DIAGNOSIS — H9202 Otalgia, left ear: Secondary | ICD-10-CM

## 2013-01-08 MED ORDER — ANTIPYRINE-BENZOCAINE 5.4-1.4 % OT SOLN: 3.0000 [drp] | Freq: Three times a day (TID) | OTIC | Status: DC | PRN

## 2013-01-08 NOTE — Assessment & Plan Note (Signed)
Could have been from ETD.  Resolved now.  Normal valsalva.  Normal exam w/o.  Would gently pop ears bid and hold rs for auralgan prn; f/u prn.  She agrees.

## 2013-01-08 NOTE — Patient Instructions (Signed)
Gently pop your ears twice a day and use the drops if needed.  Take care.

## 2013-01-08 NOTE — Progress Notes (Signed)
No current sx other that L ear pain.  No R ear sx.  She's had trouble with L ear over the years.  Sx started yesterday.  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  No FCNAV.  No rhinorrhea.  She packed the L ear with cotton and that helped with the pain.  The L ear pain is better now.   She continues to care for her husband and this is tough for her.  Inc in zoloft per PCP helped some.    Meds, vitals, and allergies reviewed.   ROS: See HPI.  Otherwise, noncontributory.  nad ncat Tm wnl She can clear ETs x2 No wax in canal TMJ not ttp Nasal and OP exam wnl

## 2013-01-17 ENCOUNTER — Other Ambulatory Visit: Payer: Self-pay | Admitting: *Deleted

## 2013-01-17 MED ORDER — RANITIDINE HCL 150 MG PO TABS: 150.0000 mg | ORAL_TABLET | Freq: Two times a day (BID) | ORAL | Status: DC

## 2013-01-31 ENCOUNTER — Ambulatory Visit (INDEPENDENT_AMBULATORY_CARE_PROVIDER_SITE_OTHER): Payer: Medicare Other | Admitting: Family Medicine

## 2013-01-31 ENCOUNTER — Encounter: Payer: Self-pay | Admitting: Family Medicine

## 2013-01-31 ENCOUNTER — Telehealth: Payer: Self-pay | Admitting: Family Medicine

## 2013-01-31 VITALS — BP 118/68 | HR 78 | Temp 97.6°F | Ht 62.25 in | Wt 140.8 lb

## 2013-01-31 DIAGNOSIS — N390 Urinary tract infection, site not specified: Secondary | ICD-10-CM

## 2013-01-31 DIAGNOSIS — R300 Dysuria: Secondary | ICD-10-CM

## 2013-01-31 LAB — POCT URINALYSIS DIPSTICK
Spec Grav, UA: <=1.005
pH, UA: 6

## 2013-01-31 MED ORDER — CIPROFLOXACIN HCL 250 MG PO TABS: 250.0000 mg | ORAL_TABLET | Freq: Two times a day (BID) | ORAL | Status: DC

## 2013-01-31 NOTE — Progress Notes (Signed)
Subjective:    Patient ID: Monica Edwards, female    DOB: 1941/05/23, 72 y.o.   MRN: [id number]  HPI Here with symptoms of a bladder infection  Bad pain with urination - did get some azo and that is helpful  Bladder pain and also low back pain  No nausea or fever  Does not get utis very often  No blood in urine      Chemistry      Component Value Date/Time   NA 140 11/03/2012 1528   K 4.3 11/03/2012 1528   CL 105 11/03/2012 1528   CO2 26 11/03/2012 1528   BUN 16 11/03/2012 1528   CREATININE 1.05 11/03/2012 1528   CREATININE 1.5* 01/19/2011 0909      Component Value Date/Time   CALCIUM 9.1 11/03/2012 1528   ALKPHOS 43 11/03/2012 1528   AST 20 11/03/2012 1528   ALT 11 11/03/2012 1528   BILITOT 0.6 11/03/2012 1528      Patient Active Problem List  Diagnosis  . HYPOTHYROIDISM  . DEPRESSION  . VENOUS INSUFFICIENCY  . ALLERGIC RHINITIS  . GERD  . LOW BACK PAIN, CHRONIC  . OSTEOPOROSIS  . CAROTID BRUIT  . MITRAL VALVE PROLAPSE, HX OF  . HEMATURIA, HX OF  . Hyperlipidemia  . Cervical spine disease  . Impacted cerumen of left ear  . Ear pain  . UTI (urinary tract infection)   Past Medical History  Diagnosis Date  . Allergy   . Depression   . Thyroid disease   . Osteoporosis 2006   Past Surgical History  Procedure Laterality Date  . Brain surgery angionoma  1988  . Cholecystectomy  06/15/2004  . Spondylosis  08/26/2003    L-S   History  Substance Use Topics  . Smoking status: Never Smoker   . Smokeless tobacco: Not on file  . Alcohol Use: No   No family history on file. Allergies  Allergen Reactions  . Alendronate Sodium     REACTION: intolerant  . Ezetimibe-Simvastatin     REACTION: side effects  . Pravastatin Sodium     REACTION: intolerant  . Relafen (Nabumetone)     Chest pain  . Risedronate Sodium     REACTION: intolerant   Current Outpatient Prescriptions on File Prior to Visit  Medication Sig Dispense Refill  . Biotin 5000 MCG CAPS Take 1 capsule by  mouth daily.        . Calcium Carbonate-Vit D-Min 600-400 MG-UNIT TABS Take 1 tablet by mouth daily.        . cholecalciferol (VITAMIN D) 1000 UNITS tablet Take 1,000 Units by mouth 3 (three) times daily.      . fish oil-omega-3 fatty acids 1000 MG capsule Take 1 g by mouth 3 (three) times daily.        Marland Kitchen levothyroxine (SYNTHROID) 112 MCG tablet Take one by mouth daily  30 tablet  5  . meclizine (ANTIVERT) 25 MG tablet Take 25 mg by mouth 3 (three) times daily as needed.      . Multiple Vitamin (MULTIVITAMIN PO) Take 1 tablet by mouth daily.        . ranitidine (ZANTAC) 150 MG tablet Take 1 tablet (150 mg total) by mouth 2 (two) times daily.  180 tablet  1  . sertraline (ZOLOFT) 100 MG tablet Take 2 tablets (200 mg total) by mouth daily.  90 tablet  3  . vitamin E 400 UNIT capsule Take 400 Units by mouth 2 (two) times daily.  No current facility-administered medications on file prior to visit.    Review of Systems Review of Systems  Constitutional: Negative for fever, appetite change, fatigue and unexpected weight change.  Eyes: Negative for pain and visual disturbance.  Respiratory: Negative for cough and shortness of breath.   Cardiovascular: Negative for cp or palpitations    Gastrointestinal: Negative for nausea, diarrhea and constipation.  Genitourinary: pos  for urgency and frequency. neg for flank pain or n/v Skin: Negative for pallor or rash   Neurological: Negative for weakness, light-headedness, numbness and headaches.  Hematological: Negative for adenopathy. Does not bruise/bleed easily.  Psychiatric/Behavioral: Negative for dysphoric mood. The patient is not nervous/anxious.         Objective:   Physical Exam  Constitutional: She appears well-developed and well-nourished. No distress.  HENT:  Head: Normocephalic and atraumatic.  Eyes: Conjunctivae are normal.  Neck: Normal range of motion. Neck supple.  Cardiovascular: Normal rate and regular rhythm.    Pulmonary/Chest: Effort normal and breath sounds normal.  Abdominal: Soft. Bowel sounds are normal. She exhibits no distension and no mass. There is tenderness in the suprapubic area. There is no rebound and no guarding.  Musculoskeletal:  No cva tenderness   Lymphadenopathy:    She has no cervical adenopathy.  Neurological: She is alert.  Skin: Skin is warm and dry. No rash noted.  Psychiatric: She has a normal mood and affect.          Assessment & Plan:

## 2013-01-31 NOTE — Patient Instructions (Addendum)
Drink lots of water Take cipro twice daily for 5 days- finish the whole course If worse or not improving please let me know  I sent the medicine to [facility]  You can continue the AZO for several more days if needed

## 2013-01-31 NOTE — Telephone Encounter (Signed)
Patient Information:  Caller Name: Josselin  Phone: [phone]  Patient: Monica Edwards, Monica Edwards  Gender: Female  DOB: 1941-06-11  Age: 72 Years  PCP: Ruthe Mannan [facility])  Office Follow Up:  Does the office need to follow up with this patient?: No  Instructions For The Office: N/A   Symptoms  Reason For Call & Symptoms: Patient states onset of symptoms of UTI today.  Lower back discomfort, pain with urination +burning, +frequency, +urgency. No blood in urine.  She started Azo two hours ago.  Last UOP- 10 minutes.      LOV 01/08/13  Reviewed Health History In EMR: Yes  Reviewed Medications In EMR: Yes  Reviewed Allergies In EMR: Yes  Reviewed Surgeries / Procedures: Yes  Date of Onset of Symptoms: 01/31/2013  Treatments Tried: Azo  Treatments Tried Worked: No  Guideline(s) Used:  Urination Pain - Female  Disposition Per Guideline:   Go to Office Now  Reason For Disposition Reached:   Side (flank) or lower back pain present  Advice Given:  Fluids:   Drink extra fluids. Drink 8-10 glasses of liquids a day (Reason: to produce a dilute, non-irritating urine).  Call Back If:  You become worse.  Warm Saline SITZ Baths to Reduce Pain:  Sit in a warm saline bath for 20 minutes to cleanse the area and to reduce pain. Add 2 oz. of table salt or baking soda to a tub of water.  Patient Will Follow Care Advice:  YES  Appointment Scheduled:  01/31/2013 16:00:00 Appointment Scheduled Provider:  Roxy Manns [facility])

## 2013-01-31 NOTE — Telephone Encounter (Signed)
Pt was seen

## 2013-02-01 LAB — POCT UA - MICROSCOPIC ONLY
Casts, Ur, LPF, POC: 0
Yeast, UA: 0

## 2013-05-14 ENCOUNTER — Encounter: Payer: Self-pay | Admitting: Family Medicine

## 2013-05-14 ENCOUNTER — Ambulatory Visit (INDEPENDENT_AMBULATORY_CARE_PROVIDER_SITE_OTHER): Payer: Medicare Other | Admitting: Family Medicine

## 2013-05-14 VITALS — BP 120/80 | HR 76 | Temp 98.1°F | Ht 62.25 in | Wt 139.0 lb

## 2013-05-14 DIAGNOSIS — F3289 Other specified depressive episodes: Secondary | ICD-10-CM

## 2013-05-14 DIAGNOSIS — H9190 Unspecified hearing loss, unspecified ear: Secondary | ICD-10-CM

## 2013-05-14 DIAGNOSIS — R413 Other amnesia: Secondary | ICD-10-CM

## 2013-05-14 DIAGNOSIS — H9192 Unspecified hearing loss, left ear: Secondary | ICD-10-CM | POA: Insufficient documentation

## 2013-05-14 DIAGNOSIS — F329 Major depressive disorder, single episode, unspecified: Secondary | ICD-10-CM

## 2013-05-14 NOTE — Patient Instructions (Addendum)
Good to see you. Please come to see after things settle with your husband.  Please stop by to see Shirlee Limerick on your way out to set up your referral.

## 2013-05-14 NOTE — Progress Notes (Signed)
72 yo very pleasant female here with her daughter to discuss depression and memory issues.  Depression- feels increased dose of Zoloft has helped quite a bit.  Does still get frustrated and easily tearful with her husband's progressive decline.  He has severe agitation and aggressive behavior with end stage dementia. She is exhausted.  Denies SI or HI.  Memory loss- at times, forgets where she placed something or when something happened but never forgets where she lives or people she knows.  Has known left hearing loss. She has refused hearing aids in past.  She would like to readdress that issue.   Past Medical History  Diagnosis Date  . Allergy   . Depression   . Thyroid disease   . Osteoporosis 2006   Past Surgical History  Procedure Laterality Date  . Brain surgery angionoma  1988  . Cholecystectomy  06/15/2004  . Spondylosis  08/26/2003    L-S   History  Substance Use Topics  . Smoking status: Never Smoker   . Smokeless tobacco: Not on file  . Alcohol Use: No   No family history on file. Allergies  Allergen Reactions  . Alendronate Sodium     REACTION: intolerant  . Ezetimibe-Simvastatin     REACTION: side effects  . Pravastatin Sodium     REACTION: intolerant  . Relafen (Nabumetone)     Chest pain  . Risedronate Sodium     REACTION: intolerant   Current Outpatient Prescriptions on File Prior to Visit  Medication Sig Dispense Refill  . Biotin 5000 MCG CAPS Take 1 capsule by mouth daily.        . Calcium Carbonate-Vit D-Min 600-400 MG-UNIT TABS Take 1 tablet by mouth daily.        . cholecalciferol (VITAMIN D) 1000 UNITS tablet Take 1,000 Units by mouth 3 (three) times daily.      . fish oil-omega-3 fatty acids 1000 MG capsule Take 1 g by mouth 3 (three) times daily.        Marland Kitchen levothyroxine (SYNTHROID) 112 MCG tablet Take one by mouth daily  30 tablet  5  . meclizine (ANTIVERT) 25 MG tablet Take 25 mg by mouth 3 (three) times daily as needed.      . Multiple  Vitamin (MULTIVITAMIN PO) Take 1 tablet by mouth daily.        . ranitidine (ZANTAC) 150 MG tablet Take 1 tablet (150 mg total) by mouth 2 (two) times daily.  180 tablet  1  . sertraline (ZOLOFT) 100 MG tablet Take 2 tablets (200 mg total) by mouth daily.  90 tablet  3  . vitamin E 400 UNIT capsule Take 400 Units by mouth 2 (two) times daily.       No current facility-administered medications on file prior to visit.     The PMH, PSH, Social History, Family History, Medications, and allergies have been reviewed in [facility], and have been updated if relevant.   Review of Systems       See HPI   Physical Exam BP 120/80  Pulse 76  Temp(Src) 98.1 F (36.7 C)  Ht 5' 2.25" (1.581 m)  Wt 139 lb (63.05 kg)  BMI 25.22 kg/m2 General:  Well-developed,well-nourished,in no acute distress; alert,appropriate and cooperative throughout examination Head:  normocephalic and atraumatic.   Psych:  Cognition and judgment appear intact. Alert and cooperative with normal attention span and concentration. No apparent delusions, illusions, hallucinations  Assessment and Plan:  1. Hearing loss in left ear  -  Ambulatory referral to Audiology  2. Memory loss >25 min spent with face to face with patient, >50% counseling and/or coordinating care  Seems more consistent with memory loss due to severe stressors she is dealing with. She did not have a good meeting with Dr. Laymond Purser.  I did encourage going to dementia support group.  She will consider this.  We also talked about SNF placement for her family. They have been considering this.  Once her husband is placed, she will come back to see me for formal memory evaluation.  3. DEPRESSION See above. No changes to antidepressants.

## 2013-06-05 ENCOUNTER — Other Ambulatory Visit: Payer: Self-pay | Admitting: *Deleted

## 2013-06-05 MED ORDER — LEVOTHYROXINE SODIUM 112 MCG PO TABS: ORAL_TABLET | ORAL | Status: DC

## 2013-06-06 ENCOUNTER — Encounter: Payer: Self-pay | Admitting: *Deleted

## 2013-06-06 ENCOUNTER — Other Ambulatory Visit: Payer: Self-pay

## 2013-07-31 ENCOUNTER — Other Ambulatory Visit: Payer: Self-pay | Admitting: *Deleted

## 2013-07-31 MED ORDER — RANITIDINE HCL 150 MG PO TABS: 150.0000 mg | ORAL_TABLET | Freq: Two times a day (BID) | ORAL | Status: DC

## 2013-08-27 ENCOUNTER — Telehealth: Payer: Self-pay

## 2013-08-27 NOTE — Telephone Encounter (Signed)
Pt left v/m that pt takes care of alzheimer pt and cannot come in for appt; pt has low back pain, burning upon urination,urgency to void and sometimes incontinent of urine before can get to restroom. No fever and no abd pain. Pt request med sent to [facility].Please advise. Pt request cb when med called in.

## 2013-08-27 NOTE — Telephone Encounter (Signed)
She's gonna need an OV 

## 2013-08-28 NOTE — Telephone Encounter (Signed)
Patient notified as instructed by telephone. Explained to patient why she would need an appointment to have urine checked. Patient stated that she will see what she can work out for tomorrow to get someone to sit with her patient. Patient stated that she will call back today and schedule an appointment for tomorrow if she can work it out.

## 2013-10-09 ENCOUNTER — Other Ambulatory Visit: Payer: Self-pay | Admitting: Family Medicine

## 2013-10-09 NOTE — Telephone Encounter (Signed)
Pt requesting medication refill. Last given 05/2012 with last ov 05/14/2013. No future appts scheduled. pls advise

## 2013-10-10 NOTE — Telephone Encounter (Signed)
Spoke to pt and informed her that an OV is needed before refill can be granted. States that she will view her schedule and contact the office back to schedule f/u appt 

## 2013-10-10 NOTE — Telephone Encounter (Signed)
Spoke to pt and informed her that an OV is needed before refill can be granted. States that she will view her schedule and contact the office back to schedule f/u appt

## 2013-10-10 NOTE — Telephone Encounter (Signed)
She hasn't taken this med in over a year, she will need an OV to discuss

## 2013-12-06 ENCOUNTER — Other Ambulatory Visit: Payer: Self-pay | Admitting: Family Medicine

## 2013-12-10 ENCOUNTER — Other Ambulatory Visit: Payer: Self-pay | Admitting: Family Medicine

## 2013-12-17 ENCOUNTER — Other Ambulatory Visit: Payer: Self-pay | Admitting: Family Medicine

## 2013-12-17 ENCOUNTER — Ambulatory Visit (INDEPENDENT_AMBULATORY_CARE_PROVIDER_SITE_OTHER): Payer: Medicare Other | Admitting: Family Medicine

## 2013-12-17 ENCOUNTER — Encounter: Payer: Self-pay | Admitting: Family Medicine

## 2013-12-17 VITALS — BP 126/62 | HR 79 | Temp 98.0°F | Ht 61.5 in | Wt 135.8 lb

## 2013-12-17 DIAGNOSIS — F3289 Other specified depressive episodes: Secondary | ICD-10-CM

## 2013-12-17 DIAGNOSIS — F329 Major depressive disorder, single episode, unspecified: Secondary | ICD-10-CM

## 2013-12-17 DIAGNOSIS — E039 Hypothyroidism, unspecified: Secondary | ICD-10-CM

## 2013-12-17 DIAGNOSIS — E785 Hyperlipidemia, unspecified: Secondary | ICD-10-CM

## 2013-12-17 LAB — COMPREHENSIVE METABOLIC PANEL
ALBUMIN: 3.9 g/dL (ref 3.5–5.2)
ALT: 19 U/L (ref 0–35)
AST: 24 U/L (ref 0–37)
Alkaline Phosphatase: 45 U/L (ref 39–117)
BUN: 17 mg/dL (ref 6–23)
CALCIUM: 9.3 mg/dL (ref 8.4–10.5)
CHLORIDE: 104 meq/L (ref 96–112)
CO2: 29 meq/L (ref 19–32)
CREATININE: 1.2 mg/dL (ref 0.4–1.2)
GFR: 48.28 mL/min — AB (ref >60.00)
Glucose, Bld: 119 mg/dL — ABNORMAL HIGH (ref 70–99)
Potassium: 3.6 mEq/L (ref 3.5–5.1)
Sodium: 140 mEq/L (ref 135–145)
Total Bilirubin: 0.7 mg/dL (ref 0.3–1.2)
Total Protein: 6.9 g/dL (ref 6.0–8.3)

## 2013-12-17 LAB — LIPID PANEL
CHOL/HDL RATIO: 9
Cholesterol: 308 mg/dL — ABNORMAL HIGH (ref 0–200)
HDL: 33.8 mg/dL — AB (ref >39.00)
LDL Cholesterol: 218 mg/dL — ABNORMAL HIGH (ref 0–99)
TRIGLYCERIDES: 283 mg/dL — AB (ref 0.0–149.0)
VLDL: 56.6 mg/dL — AB (ref 0.0–40.0)

## 2013-12-17 LAB — TSH: TSH: 2.34 u[IU]/mL (ref 0.35–5.50)

## 2013-12-17 LAB — T4, FREE: Free T4: 0.79 ng/dL (ref 0.60–1.60)

## 2013-12-17 MED ORDER — LEVOTHYROXINE SODIUM 112 MCG PO TABS: 112.0000 ug | ORAL_TABLET | Freq: Every day | ORAL | Status: DC

## 2013-12-17 MED ORDER — ESCITALOPRAM OXALATE 20 MG PO TABS: 20.0000 mg | ORAL_TABLET | Freq: Every day | ORAL | Status: DC

## 2013-12-17 NOTE — Assessment & Plan Note (Signed)
Deteriorating.  Refusing further psychotherapy.  Feels she has been on zoloft too long and would like to try something else. Wean off zoloft, start lexapro 20 mg daily. She will call me with an update in a few weeks. The patient indicates understanding of these issues and agrees with the plan.

## 2013-12-17 NOTE — Progress Notes (Signed)
73 yo very pleasant female here for one month follow up.  Hypothyroidism- on synthroid 112 mcg daily.  Due for labs.  Denies any symptoms of hypo or hyperthyroidism.   Hyperlipidemia-  Lab Results  Component Value Date   CHOL 266* 11/03/2012   HDL 37* 11/03/2012   LDLCALC 177* 11/03/2012   LDLDIRECT 127.6 03/17/2011   TRIG 261* 11/03/2012   CHOLHDL 7.2 11/03/2012    Was previously well controlled Zocor 40 mg daily and fish oil. She stopped taking it.  No side effects, just thought she did not need it.    Depression- on Zoloft, increased to 200 mg daily..  She initially noticed an improvement in her depressive symptoms. Cares for her husband who has Alzheimers.  They do have more help now but she does admit to feeling more depressed.  I referred her to see Dr. Wendy PoetPerrin-saw her twice and then stopped going because she felt it wasn't working.  + anxiety  + insomnia + depression  No SI or HI   Patient Active Problem List   Diagnosis Date Noted  . 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 Memory loss 05/14/2013  . UTI (urinary tract infection) 01/31/2013  . Ear pain 01/08/2013  . Impacted cerumen of left ear 12/08/2012  . 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 Hyperlipidemia 02/05/2011  . CAROTID BRUIT 12/03/2009  . HEMATURIA, HX OF 11/12/2009  . GERD 08/13/2009  . MITRAL VALVE PROLAPSE, HX OF 04/17/2008  . HYPOTHYROIDISM 01/18/2007  . DEPRESSION 01/18/2007  . VENOUS INSUFFICIENCY 01/18/2007  . ALLERGIC RHINITIS 01/18/2007  . LOW BACK PAIN, CHRONIC 01/18/2007  . OSTEOPOROSIS 01/18/2007   Past Medical History  Diagnosis Date  . Allergy   . Depression   . Thyroid disease   . Osteoporosis 2006   Past Surgical History  Procedure Laterality Date  . Brain surgery angionoma  1988  . Cholecystectomy  06/15/2004  . Spondylosis  08/26/2003    L-S   History  Substance Use Topics  . Smoking status: Never Smoker   . Smokeless tobacco: Not on file  . Alcohol Use: No   No family  history on file. Allergies  Allergen Reactions  . Alendronate Sodium     REACTION: intolerant  . Ezetimibe-Simvastatin     REACTION: side effects  . Pravastatin Sodium     REACTION: intolerant  . Relafen [Nabumetone]     Chest pain  . Risedronate Sodium     REACTION: intolerant   Current Outpatient Prescriptions on File Prior to Visit  Medication Sig Dispense Refill  . Biotin 5000 MCG CAPS Take 1 capsule by mouth daily.        . Calcium Carbonate-Vit D-Min 600-400 MG-UNIT TABS Take 1 tablet by mouth daily.        . cholecalciferol (VITAMIN D) 1000 UNITS tablet Take 1,000 Units by mouth 3 (three) times daily.      . fish oil-omega-3 fatty acids 1000 MG capsule Take 1 g by mouth 3 (three) times daily.        . meclizine (ANTIVERT) 25 MG tablet Take 25 mg by mouth 3 (three) times daily as needed.      . Multiple Vitamin (MULTIVITAMIN PO) Take 1 tablet by mouth daily.        . ranitidine (ZANTAC) 150 MG tablet Take 1 tablet (150 mg total) by mouth 2 (two) times daily.  180 tablet  1  . sertraline (ZOLOFT) 100 MG tablet TAKE TWO (2) TABLETS BY MOUTH DAILY  180 tablet  0  . vitamin E 400 UNIT capsule Take 400 Units by mouth 2 (two) times daily.       No current facility-administered medications on file prior to visit.     The PMH, PSH, Social History, Family History, Medications, and allergies have been reviewed in [facility], and have been updated if relevant.   Review of Systems       See HPI   Physical Exam BP 126/62  Pulse 79  Temp(Src) 98 F (36.7 C) (Oral)  Ht 5' 1.5" (1.562 m)  Wt 135 lb 12 oz (61.576 kg)  BMI 25.24 kg/m2  SpO2 99% General:  Well-developed,well-nourished,in no acute distress; alert,appropriate and cooperative throughout examination Head:  normocephalic and atraumatic.   Eyes:  vision grossly intact, pupils equal, pupils round, and pupils reactive to light.   Nose:  no external deformity.   Mouth:  good dentition.   Neck:  No deformities, masses, or  tenderness noted. Lungs:  Normal respiratory effort, chest expands symmetrically. Lungs are clear to auscultation, no crackles or wheezes. Heart:  Normal rate and regular rhythm. S1 and S2 normal without gallop, murmur, click, rub or other extra sounds. Abdomen:  Bowel sounds positive,abdomen soft and non-tender without masses, organomegaly or hernias noted. Msk:  No deformity or scoliosis noted of thoracic or lumbar spine.   Extremities:  No clubbing, cyanosis, edema, or deformity noted with normal full range of motion of all joints.   Neurologic:  alert & oriented X3 and gait normal.   Skin:  Intact without suspicious lesions or rashes Psych:  Cognition and judgment appear intact. Alert and cooperative with normal attention span and concentration. No apparent delusions, illusions, hallucinations  Assessment and Plan:

## 2013-12-17 NOTE — Patient Instructions (Signed)
Let's wean off sertraline- 1/2 tab every other day for 2 weeks and stop.  If you feel strange, we [facility] need to call in lower dose of sertraline for weaning.  Let's lexapro.  Call me in a few weeks with an update.

## 2013-12-17 NOTE — Assessment & Plan Note (Signed)
Recheck labs today. 

## 2013-12-17 NOTE — Progress Notes (Signed)
Pre visit review using our clinic review tool, if applicable. No additional management support is needed unless otherwise documented below in the visit note. 

## 2013-12-17 NOTE — Assessment & Plan Note (Signed)
Recheck labs today. Orders Placed This Encounter  Procedures  . TSH  . T4, Free  . Comprehensive metabolic panel  . Lipid panel

## 2013-12-21 ENCOUNTER — Telehealth: Payer: Self-pay

## 2013-12-21 NOTE — Telephone Encounter (Signed)
Pt left v/m to let Dr Dayton MartesAron know she has started taking her cholesterol med. Pt does not need cb.

## 2014-01-01 ENCOUNTER — Telehealth: Payer: Self-pay | Admitting: *Deleted

## 2014-01-01 DIAGNOSIS — F32A Depression, unspecified: Secondary | ICD-10-CM

## 2014-01-01 DIAGNOSIS — F329 Major depressive disorder, single episode, unspecified: Secondary | ICD-10-CM

## 2014-01-01 NOTE — Telephone Encounter (Signed)
Spoke to pts daughter and informed her per Dr Dayton MartesAron, ok to start zoloft directly

## 2014-01-01 NOTE — Telephone Encounter (Signed)
Spoke to pts daughter Terisa. She is wondering since pt only took one dose of Lexapro, is pt able to just began taking zoloft immediately? She is not wanting her to wean off of it as instructed.

## 2014-01-01 NOTE — Telephone Encounter (Signed)
Oh yes.  I'm sorry- I didn't realize she had only taken one dose. She does not need to wean and can start zoloft directly.  Sorry I missed that from your initial note.
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
## 2014-01-01 NOTE — Telephone Encounter (Signed)
She is already on maximum dose of lexapro- 20 mg daily so we cannot increase this. We  certainly wean off the lexapro (1/2 tab every other day for one week and stop) and restart the zoloft at her previous dose. At this point, I would recommend that she definitely needs to see a psychiatrist for medication management and counseling given the new violent behaviors. Referral placed for psychiatrist.

## 2014-01-09 ENCOUNTER — Telehealth: Payer: Self-pay | Admitting: Family Medicine

## 2014-01-09 NOTE — Telephone Encounter (Signed)
Called Rosey Batheresa ( daughter) and told her what Dr Dayton MartesAron said. She asked me to cancel the referral to Psychiatry at this time. I called [facility] and Nedra HaiLee will cancel the referral.

## 2014-01-09 NOTE — Telephone Encounter (Signed)
Called [facility] to check on Urgent Referral you put on. They have called to get her set up yesterday but I called her daughter Rosey Batheresa and Rosey Batheresa says that Monica Edwards is feeling much better on the Lexapro and doesn't want to go to see the specialist at this time. Please advise if you are okay with refilling this medication as she is doing better and feels well on it. Can we cancel the referral at this time please advise and call Rosey Batheresa with your decision at [phone], then I can cancel the referral at their office also.

## 2014-01-09 NOTE — Telephone Encounter (Signed)
Yes that is ok to cancel referral and to refill lexapro at current dose.  I do think she would benefit from psychotherapy at some point.

## 2014-02-18 ENCOUNTER — Other Ambulatory Visit: Payer: Self-pay | Admitting: Family Medicine

## 2014-05-10 ENCOUNTER — Ambulatory Visit: Payer: Self-pay | Admitting: Family Medicine

## 2014-05-15 ENCOUNTER — Other Ambulatory Visit: Payer: Self-pay | Admitting: Family Medicine

## 2014-05-15 ENCOUNTER — Encounter: Payer: Self-pay | Admitting: Family Medicine

## 2014-05-15 ENCOUNTER — Ambulatory Visit (INDEPENDENT_AMBULATORY_CARE_PROVIDER_SITE_OTHER): Payer: Medicare Other | Admitting: Family Medicine

## 2014-05-15 VITALS — BP 122/72 | HR 70 | Temp 98.0°F | Ht 61.5 in | Wt 138.5 lb

## 2014-05-15 DIAGNOSIS — R413 Other amnesia: Secondary | ICD-10-CM

## 2014-05-15 DIAGNOSIS — R5381 Other malaise: Secondary | ICD-10-CM | POA: Insufficient documentation

## 2014-05-15 DIAGNOSIS — R5383 Other fatigue: Secondary | ICD-10-CM

## 2014-05-15 MED ORDER — CYANOCOBALAMIN 1000 MCG/ML IJ SOLN
1000.0000 ug | INTRAMUSCULAR | Status: AC
  Administered 2014-05-15: 1000 ug via INTRAMUSCULAR

## 2014-05-15 NOTE — Patient Instructions (Signed)
Great to see you. We will call you with your lab results. Please stop by to see Marion on your way out.  

## 2014-05-15 NOTE — Progress Notes (Signed)
73 yo very pleasant female here with her daughter to discuss memory issues.   Memory loss- getting worse past 3 months. Saw her for this complaint last year- minor symptoms- would forget where she placed something or when something happened but would never forgets where she lives or people she knows.  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  He has been in an ALF since 02/2014 and family thought her symptoms would improve.  They have instead become more worrisome- she repeatedly asks the same question, forgot how to get home from her husband's ALF.  She is also very fatigued.  Lab Results  Component Value Date   TSH 2.34 12/17/2013   Lab Results  Component Value Date   VITAMINB12 302 05/20/2008    Past Medical History  Diagnosis Date  . Allergy   . Depression   . Thyroid disease   . Osteoporosis 2006   Past Surgical History  Procedure Laterality Date  . Brain surgery angionoma  1988  . Cholecystectomy  06/15/2004  . Spondylosis  08/26/2003    L-S   History  Substance Use Topics  . Smoking status: Never Smoker   . Smokeless tobacco: Not on file  . Alcohol Use: No   No family history on file. Allergies  Allergen Reactions  . Alendronate Sodium     REACTION: intolerant  . Ezetimibe-Simvastatin     REACTION: side effects  . Pravastatin Sodium     REACTION: intolerant  . Relafen [Nabumetone]     Chest pain  . Risedronate Sodium     REACTION: intolerant   Current Outpatient Prescriptions on File Prior to Visit  Medication Sig Dispense Refill  . Biotin 5000 MCG CAPS Take 1 capsule by mouth daily.        . Calcium Carbonate-Vit D-Min 600-400 MG-UNIT TABS Take 1 tablet by mouth daily.        . cholecalciferol (VITAMIN D) 1000 UNITS tablet Take 1,000 Units by mouth 3 (three) times daily.      . fish oil-omega-3 fatty acids 1000 MG capsule Take 1 g by mouth 3 (three) times daily.        Marland Kitchen. levothyroxine (SYNTHROID, LEVOTHROID) 112 MCG  tablet Take 1 tablet (112 mcg total) by mouth daily.  30 tablet  3  . meclizine (ANTIVERT) 25 MG tablet Take 25 mg by mouth 3 (three) times daily as needed.      . Multiple Vitamin (MULTIVITAMIN PO) Take 1 tablet by mouth daily.        . ranitidine (ZANTAC) 150 MG tablet TAKE 1 TABLET BY MOUTH TWICE A DAY  180 tablet  1  . vitamin E 400 UNIT capsule Take 400 Units by mouth 2 (two) times daily.       No current facility-administered medications on file prior to visit.     The PMH, PSH, Social History, Family History, Medications, and allergies have been reviewed in [facility], and have been updated if relevant.   Review of Systems       See HPI Denies feeling depressed or anxious  Physical Exam BP 122/72  Pulse 70  Temp(Src) 98 F (36.7 C) (Oral)  Ht 5' 1.5" (1.562 m)  Wt 138 lb 8 oz (62.823 kg)  BMI 25.75 kg/m2  SpO2 96% General:  Well-developed,well-nourished,in no acute distress; alert,appropriate and cooperative throughout examination Head:  normocephalic and atraumatic.   Psych:  Cognition and judgment appear intact. Alert and cooperative with  normal attention span and concentration. No apparent delusions, illusions, hallucinations Oriented x 3

## 2014-05-15 NOTE — Assessment & Plan Note (Signed)
Deteriorated. >25 minutes spent in face to face time with patient, >50% spent in counselling or coordination of care. Daughter is asking for referral to memory clinic in [place]- referral placed. Check labs today. The patient indicates understanding of these issues and agrees with the plan.

## 2014-05-15 NOTE — Progress Notes (Signed)
Pre visit review using our clinic review tool, if applicable. No additional management support is needed unless otherwise documented below in the visit note. 

## 2014-05-15 NOTE — Assessment & Plan Note (Signed)
Check labs today. Orders Placed This Encounter  Procedures  . Vitamin B12  . TSH  . T4, Free  . Comprehensive metabolic panel  . CBC with Differential  . Vitamin D, 25-hydroxy  . Ambulatory referral to Geriatrics

## 2014-05-15 NOTE — Addendum Note (Signed)
Addended by: Desmond DikeKNIGHT, Simisola Sandles H on: 05/15/2014 03:58 PM   Modules accepted: Orders

## 2014-05-16 LAB — VITAMIN D 25 HYDROXY (VIT D DEFICIENCY, FRACTURES): VITD: 54.77 ng/mL (ref 30.00–100.00)

## 2014-05-16 LAB — COMPREHENSIVE METABOLIC PANEL
ALT: 17 U/L (ref 0–35)
AST: 29 U/L (ref 0–37)
Albumin: 4.1 g/dL (ref 3.5–5.2)
Alkaline Phosphatase: 43 U/L (ref 39–117)
BUN: 15 mg/dL (ref 6–23)
CALCIUM: 9 mg/dL (ref 8.4–10.5)
CO2: 28 mEq/L (ref 19–32)
CREATININE: 1.2 mg/dL (ref 0.4–1.2)
Chloride: 107 mEq/L (ref 96–112)
GFR: 45.95 mL/min — ABNORMAL LOW (ref >60.00)
GLUCOSE: 98 mg/dL (ref 70–99)
Potassium: 4.3 mEq/L (ref 3.5–5.1)
Sodium: 140 mEq/L (ref 135–145)
Total Bilirubin: 0.7 mg/dL (ref 0.2–1.2)
Total Protein: 7.1 g/dL (ref 6.0–8.3)

## 2014-05-16 LAB — VITAMIN B12: Vitamin B-12: >1500 pg/mL — ABNORMAL HIGH (ref 211–911)

## 2014-05-16 LAB — T4, FREE: Free T4: 0.96 ng/dL (ref 0.60–1.60)

## 2014-06-03 ENCOUNTER — Other Ambulatory Visit: Payer: Self-pay | Admitting: Family Medicine

## 2014-06-19 ENCOUNTER — Ambulatory Visit (INDEPENDENT_AMBULATORY_CARE_PROVIDER_SITE_OTHER): Payer: Medicare Other | Admitting: Family Medicine

## 2014-06-19 DIAGNOSIS — R5383 Other fatigue: Principal | ICD-10-CM

## 2014-06-19 DIAGNOSIS — R5381 Other malaise: Secondary | ICD-10-CM

## 2014-06-19 MED ORDER — CYANOCOBALAMIN 1000 MCG/ML IJ SOLN
1000.0000 ug | Freq: Once | INTRAMUSCULAR | Status: AC
  Administered 2014-06-19: 1000 ug via INTRAMUSCULAR

## 2014-07-16 ENCOUNTER — Other Ambulatory Visit: Payer: Self-pay | Admitting: Family Medicine

## 2014-07-24 ENCOUNTER — Ambulatory Visit (INDEPENDENT_AMBULATORY_CARE_PROVIDER_SITE_OTHER): Payer: Medicare Other

## 2014-07-24 ENCOUNTER — Telehealth: Payer: Self-pay

## 2014-07-24 DIAGNOSIS — E538 Deficiency of other specified B group vitamins: Secondary | ICD-10-CM

## 2014-07-24 MED ORDER — CYANOCOBALAMIN 1000 MCG/ML IJ SOLN
1000.0000 ug | Freq: Once | INTRAMUSCULAR | Status: AC
  Administered 2014-07-24: 1000 ug via INTRAMUSCULAR

## 2014-07-24 NOTE — Telephone Encounter (Signed)
Pt left v/m;pt had B 12 shot this morning and pt wants to know if needs to continue taking B 12 shots monthly and if so for how long will pt need to take injections monthly.. Pt request cb.

## 2014-07-24 NOTE — Telephone Encounter (Signed)
Yes initially monthly for 4-6 months and then we will recheck her B12 levels.

## 2014-07-25 NOTE — Telephone Encounter (Signed)
Spoke to pt and advised per Dr Aron. Pt verbally expressed understanding.  

## 2014-08-12 ENCOUNTER — Other Ambulatory Visit: Payer: Self-pay | Admitting: Family Medicine

## 2014-08-28 ENCOUNTER — Ambulatory Visit (INDEPENDENT_AMBULATORY_CARE_PROVIDER_SITE_OTHER): Payer: Medicare Other | Admitting: Family Medicine

## 2014-08-28 ENCOUNTER — Encounter: Payer: Self-pay | Admitting: Family Medicine

## 2014-08-28 VITALS — BP 112/62 | HR 59 | Temp 98.1°F | Wt 139.0 lb

## 2014-08-28 DIAGNOSIS — R197 Diarrhea, unspecified: Secondary | ICD-10-CM

## 2014-08-28 NOTE — Progress Notes (Signed)
Pre visit review using our clinic review tool, if applicable. No additional management support is needed unless otherwise documented below in the visit note. 

## 2014-08-28 NOTE — Patient Instructions (Signed)
If you diarrhea return, call me immediately- we will get a stool sample.  Also if you develop any new symptoms, call me- like nausea, vomiting, abdominal pain or fevers.  We will call Dr. Bosie ClosSchooler to get a copy of your colonoscopy.

## 2014-08-28 NOTE — Assessment & Plan Note (Signed)
New- resolved. ? Viral or something she ate.  Exam reassuring [facility] to see if any anticholinesterase inhibitors were added. Also request colonoscopy report from Dr. Bosie ClosSchooler. If symptoms return or develops any new symptoms listed in AVS, she will contact me immediately. The patient indicates understanding of these issues and agrees with the plan.

## 2014-08-28 NOTE — Progress Notes (Signed)
Subjective:    Patient ID: Monica Edwards, female    DOB: [birth year], 73 y.o.   MRN: [id number]  HPI  [age] yo pleasant female here for diarrhea.  2 days ago, had diarrhea "all day" and had episodes of bowel incontinence.  No nausea or vomiting.  No abdominal pain.  No fevers or blood in her stool.  Symptoms resolved that day and have not returned.  She did not take any antidiarrheals.  She has been seeing neurology for memory issues but she does not think aricept or other rxs were started yet.  Has follow up next month with neurology.  Current Outpatient Prescriptions on File Prior to Visit  Medication Sig Dispense Refill  . Biotin 5000 MCG CAPS Take 1 capsule by mouth daily.      . Calcium Carbonate-Vit D-Min 600-400 MG-UNIT TABS Take 1 tablet by mouth daily.      . cholecalciferol (VITAMIN D) 1000 UNITS tablet Take 1,000 Units by mouth 3 (three) times daily.    Marland Kitchen. escitalopram (LEXAPRO) 20 MG tablet TAKE 1 TABLET BY MOUTH DAILY 30 tablet 1  . fish oil-omega-3 fatty acids 1000 MG capsule Take 1 g by mouth 3 (three) times daily.      Marland Kitchen. levothyroxine (SYNTHROID, LEVOTHROID) 112 MCG tablet TAKE 1 TABLET BY MOUTH DAILY 30 tablet 5  . meclizine (ANTIVERT) 25 MG tablet Take 25 mg by mouth 3 (three) times daily as needed.    . Multiple Vitamin (MULTIVITAMIN PO) Take 1 tablet by mouth daily.      . ranitidine (ZANTAC) 150 MG tablet TAKE 1 TABLET BY MOUTH TWICE A DAY 180 tablet 1  . vitamin B-12 (CYANOCOBALAMIN) 1000 MCG tablet Take 1,000 mcg by mouth daily.    . vitamin E 400 UNIT capsule Take 400 Units by mouth 2 (two) times daily.     No current facility-administered medications on file prior to visit.    Allergies  Allergen Reactions  . Alendronate Sodium     REACTION: intolerant  . Ezetimibe-Simvastatin     REACTION: side effects  . Pravastatin Sodium     REACTION: intolerant  . Relafen [Nabumetone]     Chest pain  . Risedronate Sodium     REACTION: intolerant    Past Medical  History  Diagnosis Date  . Allergy   . Depression   . Thyroid disease   . Osteoporosis 2006    Past Surgical History  Procedure Laterality Date  . Brain surgery angionoma  1988  . Cholecystectomy  06/15/2004  . Spondylosis  08/26/2003    L-S    No family history on file.  History   Social History  . Marital Status: Married    Spouse Name: N/A    Number of Children: N/A  . Years of Education: N/A   Occupational History  . Not on file.   Social History Main Topics  . Smoking status: Never Smoker   . Smokeless tobacco: Not on file  . Alcohol Use: No  . Drug Use: No  . Sexual Activity: Not on file   Other Topics Concern  . Not on file   Social History Narrative   Husband has dementia.     Daughter is HPOA- has living will.   Desires resuscitation but does not desire prolonged life support or feeding tubes.   The PMH, PSH, Social History, Family History, Medications, and allergies [facility], and have been updated if relevant.   Review of Systems  Constitutional: Negative.   Gastrointestinal: Positive for diarrhea. Negative for abdominal pain, constipation, blood in stool, abdominal distention and anal bleeding.  Genitourinary: Negative.    See HPI    Objective:   Physical Exam  Constitutional: She appears well-developed and well-nourished. No distress.  HENT:  Head: Normocephalic.  Abdominal: Soft. Bowel sounds are normal. She exhibits no distension and no mass. There is no tenderness. There is no rebound and no guarding.  Skin: Skin is warm and dry.  Psychiatric: She has a normal mood and affect. Her behavior is normal. Judgment and thought content normal.  Nursing note and vitals reviewed.  BP 112/62 mmHg  Pulse 59  Temp(Src) 98.1 F (36.7 C) (Tympanic)  Wt 139 lb (63.05 kg)  SpO2 98%        Assessment & Plan:

## 2014-09-02 ENCOUNTER — Encounter: Payer: Self-pay | Admitting: Family Medicine

## 2014-09-06 ENCOUNTER — Ambulatory Visit (INDEPENDENT_AMBULATORY_CARE_PROVIDER_SITE_OTHER): Payer: Medicare Other

## 2014-09-06 ENCOUNTER — Ambulatory Visit: Payer: Self-pay

## 2014-09-06 DIAGNOSIS — E538 Deficiency of other specified B group vitamins: Secondary | ICD-10-CM

## 2014-09-06 MED ORDER — CYANOCOBALAMIN 1000 MCG/ML IJ SOLN
1000.0000 ug | Freq: Once | INTRAMUSCULAR | Status: AC
  Administered 2014-09-06: 1000 ug via INTRAMUSCULAR

## 2014-10-07 ENCOUNTER — Other Ambulatory Visit: Payer: Self-pay | Admitting: Family Medicine

## 2014-10-09 ENCOUNTER — Ambulatory Visit: Payer: Self-pay | Admitting: Family Medicine

## 2014-10-09 ENCOUNTER — Telehealth: Payer: Self-pay | Admitting: Family Medicine

## 2014-10-09 NOTE — Telephone Encounter (Signed)
No this is not an urgent issue.  If symptoms resolved, does not need follow up.

## 2014-10-09 NOTE — Telephone Encounter (Signed)
Patient did not come for their scheduled appointment today for acid.  Please let me know if the patient needs to be contacted immediately for follow up or if no follow up is necessary.

## 2014-10-17 ENCOUNTER — Other Ambulatory Visit: Payer: Self-pay | Admitting: Family Medicine

## 2014-10-17 DIAGNOSIS — Z1239 Encounter for other screening for malignant neoplasm of breast: Secondary | ICD-10-CM

## 2014-10-21 ENCOUNTER — Other Ambulatory Visit: Payer: Self-pay | Admitting: Family Medicine

## 2014-10-22 ENCOUNTER — Other Ambulatory Visit: Payer: Self-pay | Admitting: Family Medicine

## 2014-11-18 ENCOUNTER — Ambulatory Visit: Payer: Self-pay | Admitting: Family Medicine

## 2014-11-18 ENCOUNTER — Other Ambulatory Visit: Payer: Self-pay | Admitting: Family Medicine

## 2014-11-27 LAB — HM MAMMOGRAPHY: HM Mammogram: NORMAL

## 2014-11-29 ENCOUNTER — Other Ambulatory Visit: Payer: Self-pay | Admitting: Family Medicine

## 2014-11-29 MED ORDER — ESCITALOPRAM OXALATE 20 MG PO TABS: 20.0000 mg | ORAL_TABLET | Freq: Every day | ORAL | Status: DC

## 2014-11-29 NOTE — Telephone Encounter (Signed)
Spoke to pt who states that she did not make refill request. She states that she will get refills at 2/18 OV

## 2014-11-29 NOTE — Telephone Encounter (Signed)
Pt contacted office back and states that she did, in fact make request for refill. Advised pt that per Rene Kocheregina B, she Adamik have enough meds to last until 2/18 OV. Pt has needed f/u OV, but has cancelled appt each time, after receiving refill. #5 sent to pharmacy to last until appt

## 2014-11-29 NOTE — Addendum Note (Signed)
Addended by: Desmond DikeKNIGHT, Emanuelle Hammerstrom H on: 11/29/2014 03:49 PM   Modules accepted: Orders

## 2014-12-05 ENCOUNTER — Ambulatory Visit: Payer: Self-pay | Admitting: Family Medicine

## 2014-12-16 ENCOUNTER — Encounter: Payer: Self-pay | Admitting: Family Medicine

## 2014-12-16 ENCOUNTER — Ambulatory Visit (INDEPENDENT_AMBULATORY_CARE_PROVIDER_SITE_OTHER): Payer: Medicare Other | Admitting: Family Medicine

## 2014-12-16 VITALS — BP 122/72 | HR 62 | Temp 98.1°F | Wt 141.0 lb

## 2014-12-16 DIAGNOSIS — R413 Other amnesia: Secondary | ICD-10-CM | POA: Diagnosis not present

## 2014-12-16 DIAGNOSIS — Q282 Arteriovenous malformation of cerebral vessels: Secondary | ICD-10-CM

## 2014-12-16 DIAGNOSIS — M545 Low back pain: Secondary | ICD-10-CM

## 2014-12-16 DIAGNOSIS — Q283 Other malformations of cerebral vessels: Secondary | ICD-10-CM | POA: Diagnosis not present

## 2014-12-16 DIAGNOSIS — F32A Depression, unspecified: Secondary | ICD-10-CM

## 2014-12-16 DIAGNOSIS — F329 Major depressive disorder, single episode, unspecified: Secondary | ICD-10-CM

## 2014-12-16 DIAGNOSIS — M549 Dorsalgia, unspecified: Secondary | ICD-10-CM | POA: Insufficient documentation

## 2014-12-16 HISTORY — DX: Arteriovenous malformation of cerebral vessels: Q28.2

## 2014-12-16 LAB — COMPREHENSIVE METABOLIC PANEL
ALT: 13 U/L (ref 0–35)
AST: 21 U/L (ref 0–37)
Albumin: 4.3 g/dL (ref 3.5–5.2)
Alkaline Phosphatase: 50 U/L (ref 39–117)
BILIRUBIN TOTAL: 0.7 mg/dL (ref 0.2–1.2)
BUN: 12 mg/dL (ref 6–23)
CO2: 26 meq/L (ref 19–32)
Calcium: 9.7 mg/dL (ref 8.4–10.5)
Chloride: 105 mEq/L (ref 96–112)
Creatinine, Ser: 1.1 mg/dL (ref 0.40–1.20)
GFR: 51.7 mL/min — ABNORMAL LOW (ref >60.00)
Glucose, Bld: 81 mg/dL (ref 70–99)
Potassium: 4 mEq/L (ref 3.5–5.1)
SODIUM: 140 meq/L (ref 135–145)
Total Protein: 7.2 g/dL (ref 6.0–8.3)

## 2014-12-16 LAB — VITAMIN B12: VITAMIN B 12: 427 pg/mL (ref 211–911)

## 2014-12-16 LAB — TSH: TSH: 0.32 u[IU]/mL — ABNORMAL LOW (ref 0.35–4.50)

## 2014-12-16 MED ORDER — ESCITALOPRAM OXALATE 20 MG PO TABS: 20.0000 mg | ORAL_TABLET | Freq: Every day | ORAL | Status: DC

## 2014-12-16 NOTE — Assessment & Plan Note (Signed)
>  25 minutes spent in face to face time with patient, >50% spent in counselling or coordination of care Deteriorated.  She does repeat several questions during exam today.   Advised that she call Dr. Antonietta Barcelonaonuzi as soon as she can to schedule a follow up appointment. Will check labs today for further work up. I question if she Alonso have had GI or other side effects from aricept.  She also was not completely sure that she is not taking it now.  She will call me once to gets home and looks at her pill bottles.

## 2014-12-16 NOTE — Progress Notes (Signed)
Pre visit review using our clinic review tool, if applicable. No additional management support is needed unless otherwise documented below in the visit note. 

## 2014-12-16 NOTE — Patient Instructions (Addendum)
Good to see you.  Please call Dr. Antonietta Barcelonaonuzi to schedule a follow up appointment as soon as you can. Call me when you get home to see if you have been taking Aricept (Donepezil)  We are checking blood work today, including B12 and will call you with your results.   I refilled your Lexapro- please restart this.

## 2014-12-16 NOTE — Assessment & Plan Note (Signed)
She feels symptoms are ok but does want to restart Lexapro since she does get more weepy when her [facility]. eRx sent to pharmacy today.

## 2014-12-16 NOTE — Progress Notes (Signed)
Subjective:   Patient ID: Monica Edwards, female    DOB: [birth year], 74 y.o.   MRN: [id number]  Monica Broodeggy C Wimberly is a pleasant 74 y.o. year old female who presents to clinic today with Back Pain and Memory Loss  on 12/16/2014  HPI: Memory loss- chronic issue.   Saw Dr. 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  Also signs of hemosiderin deposition.  Suggested possible further imaging.  Took Aricept but stopped taking it and she is not sure why.  She did not follow up with him.  Feels her memory is getting worse.  Lab Results  Component Value Date   VITAMINB12 >1500* 05/15/2014   Depression- ran out of Lexapro and she is not sure when she stopped taking it.  Husband is in a SNF but she likes where he is.  She feels it is clean and the staff take good care of him.  Denies feeling depressed but she does want to restart the Lexapro-she thinks she was less "weepy at times" when she took it.  Back pain- Intermittent.  No symptoms of leg pain or radiculopathy.  Intermittent low back pain since she feel in her yard last year.  No urinary symptoms.  Not hurting today.  Current Outpatient Prescriptions on File Prior to Visit  Medication Sig Dispense Refill  . Biotin 5000 MCG CAPS Take 1 capsule by mouth daily.      . Calcium Carbonate-Vit D-Min 600-400 MG-UNIT TABS Take 1 tablet by mouth daily.      . cholecalciferol (VITAMIN D) 1000 UNITS tablet Take 1,000 Units by mouth 3 (three) times daily.    . fish oil-omega-3 fatty acids 1000 MG capsule Take 1 g by mouth 3 (three) times daily.      Marland Kitchen. levothyroxine (SYNTHROID, LEVOTHROID) 112 MCG tablet TAKE 1 TABLET BY MOUTH DAILY 30 tablet 5  . meclizine (ANTIVERT) 25 MG tablet Take 25 mg by mouth 3 (three) times daily as needed.    . Multiple Vitamin (MULTIVITAMIN PO) Take 1 tablet by mouth daily.      . ranitidine (ZANTAC) 150  MG tablet TAKE 1 TABLET BY MOUTH TWICE A DAY 180 tablet 2  . vitamin B-12 (CYANOCOBALAMIN) 1000 MCG tablet Take 1,000 mcg by mouth daily.    . vitamin E 400 UNIT capsule Take 400 Units by mouth 2 (two) times daily.     No current facility-administered medications on file prior to visit.    Allergies  Allergen Reactions  . Alendronate Sodium     REACTION: intolerant  . Ezetimibe-Simvastatin     REACTION: side effects  . Pravastatin Sodium     REACTION: intolerant  . Relafen [Nabumetone]     Chest pain  . Risedronate Sodium     REACTION: intolerant    Past Medical History  Diagnosis Date  . Allergy   . Depression   . Thyroid disease   . Osteoporosis 2006    Past Surgical History  Procedure Laterality Date  . Brain surgery angionoma  1988  . Cholecystectomy  06/15/2004  . Spondylosis  08/26/2003    L-S    History reviewed. No pertinent family history.  History   Social History  . Marital Status: Married    Spouse Name: N/A  . Number of Children: N/A  . Years of Education: N/A   Occupational History  . Not on file.  Social History Main Topics  . Smoking status: Never Smoker   . Smokeless tobacco: Not on file  . Alcohol Use: No  . Drug Use: No  . Sexual Activity: Not on file   Other Topics Concern  . Not on file   Social History Narrative   Husband has dementia.     Daughter is HPOA- has living will.   Desires resuscitation but does not desire prolonged life support or feeding tubes.   The PMH, PSH, Social History, Family History, Medications, and allergies have been reviewed in [facility], and have been updated if relevant.   Review of Systems  Constitutional: Negative.   Respiratory: Negative.   Gastrointestinal: Negative.   Genitourinary: Negative.   Musculoskeletal: Positive for back pain.  Neurological: Negative for dizziness, tremors, syncope, facial asymmetry, speech difficulty and headaches.  Psychiatric/Behavioral: Negative.  Negative for  behavioral problems, confusion, sleep disturbance, dysphoric mood and decreased concentration. The patient is not nervous/anxious.   All other systems reviewed and are negative.      Objective:    BP 122/72 mmHg  Pulse 62  Temp(Src) 98.1 F (36.7 C) (Oral)  Wt 141 lb (63.957 kg)  SpO2 94%   Physical Exam  Constitutional: She is oriented to person, place, and time. She appears well-developed and well-nourished. No distress.  HENT:  Head: Normocephalic.  Eyes: Conjunctivae are normal.  Neck: Normal range of motion.  Cardiovascular: Normal rate.   Pulmonary/Chest: Effort normal.  Abdominal: Soft.  Musculoskeletal: She exhibits no edema.  Neurological: She is alert and oriented to person, place, and time. No cranial nerve deficit.  Skin: Skin is warm and dry.  Psychiatric: She has a normal mood and affect. Her behavior is normal. Judgment normal.  Repeating questions  Nursing note and vitals reviewed.         Assessment & Plan:   Memory loss - Plan: Vitamin B12, TSH, Comprehensive metabolic panel  Low back pain without sciatica, unspecified back pain laterality  Depression No Follow-up on file.

## 2014-12-17 ENCOUNTER — Telehealth: Payer: Self-pay | Admitting: Family Medicine

## 2014-12-17 NOTE — Telephone Encounter (Signed)
Pt called stating she was in yesterday to see dr Dayton Martesaron.  She wanted to know if dr Dayton Martesaron could call her something in for Irration in her throat and she is coughing a lot  [address] [place]

## 2014-12-17 NOTE — Telephone Encounter (Signed)
There really is nothing to call in for this.  Try lozenges, drink warm tea/bevarages with honey.  Keep us updated.

## 2014-12-17 NOTE — Telephone Encounter (Signed)
Pt called back.  i red the message from dr Dayton Martesaron to patient. Pt will up date on how she is doing

## 2014-12-17 NOTE — Addendum Note (Signed)
Addended by: Desmond DikeKNIGHT, Dayja Loveridge H on: 12/17/2014 01:43 PM   Modules accepted: Medications

## 2014-12-19 ENCOUNTER — Ambulatory Visit: Payer: Self-pay

## 2014-12-19 ENCOUNTER — Telehealth: Payer: Self-pay | Admitting: Family Medicine

## 2014-12-19 NOTE — Telephone Encounter (Signed)
Pt called in to notify Dr Dayton MartesAron that she is no longer taking the Aricept. Any questions, please call the patient

## 2014-12-19 NOTE — Telephone Encounter (Signed)
Spoke to pt who states that she forgot to schedule neuro appt. Advised pt to make a note as she is being seen for memory loss, and does not want to forgot. Pt verbally expressed understanding and states that she will contact them today.

## 2014-12-19 NOTE — Telephone Encounter (Signed)
Did she call her neurologist to make an appointment as I advised?

## 2014-12-20 ENCOUNTER — Telehealth: Payer: Self-pay | Admitting: Family Medicine

## 2014-12-20 NOTE — Telephone Encounter (Signed)
[facility] Client TELEPHONE ADVICE RECORD [facility]  Patient Name: Monica Edwards  Gender: Female  DOB: Oct 04, 1941   Age: [age] Y 4 M 9 D  Return Phone Number: [phone] (Primary)  Address: [address]   City[place]Zip: [place] [id number]   Client [facility] Client  [facility]  Physician Ruthe MannanAron, Talia   Contact Type Call  Call Type Triage / Clinical     Relationship To Patient Self     Return Phone Number [phone] (Primary)  Chief Complaint Sore Throat  Initial Comment Caller states Shalika Bartnik, just saw MD couple of days ago; has drainage in her throat, hoarseness; sore throat;      PreDisposition Did not know what to do         Nurse Assessment  Nurse: Deloria LairHamilton, RN, Trish Date/Time (Eastern Time): 12/20/2014 11:29:20 AM  Confirm and document reason for call. If symptomatic, describe symptoms. ---Patient is calling for self caller states Ivana Badami, just saw MD couple of days ago; has drainage in her throat, hoarseness; sore throat; Was not given any medication. Patient is very confused. She states it has been a while she has felt confused. Has not been doing anything for hoarseness. no temp. She is able to eat and drink.  Has the patient traveled out of the country within the last 30 days? ---No  Does the patient require triage? ---Yes  Related visit to physician within the last 2 weeks? ---Yes  Does the PT have any chronic conditions? (i.e. diabetes, asthma, etc.) ---Yes  List chronic conditions. ---dementia starting     Guidelines      Guideline Title Affirmed Question Affirmed Notes Nurse Date/Time Lamount Cohen(Eastern Time)  Hoarseness Mild hoarseness (all triage questions negative)  Deloria LairHamilton, RN, Trish 12/20/2014 11:32:31 AM   Disp. Time Lamount Cohen(Eastern Time) Disposition Final User          12/20/2014 11:34:50 AM Home Care Yes Deloria LairHamilton, RN, Trish        Caller  Understands: Yes  Disagree/Comply: Comply     Care Advice Given Per Guideline      HOME CARE: You should be able to treat this at home. REASSURANCE: * Most hoarseness is an irritation of the voice box (called laryngitis) that's part of a cold. It usually lasts under 1 week. * Hoarseness due to vocal cord abuse (yelling) can have a slower recovery. * Some people can have hoarseness from nasal allergies (Hay Fever). WARM LIQUIDS soothing, warm liquids. Hot water with honey is good. Hot tea with sugar and lemon is also good. Or, you can suck on hard candy or cough drops. HUMIDIFIER: If the air in your home is dry, use a humidifier. REST THE VOICE: Avoid yelling or talking loudly. Talk as little as possible or write notes for a few days. Also avoid throat-clearing. Talking and even throat clearing can strain the vocal cords. AVOID TOBACCO SMOKE: Active or passive smoking makes coughs much worse. * Diphenhydramine (Benadryl) is available OTC. Adult dose is 25-50 mg PO. ANTIHISTAMINE FOR NASAL ALLERGIES: CALL BACK IF: * Difficulty breathing occurs * Hoarseness lasts over 2 weeks * You become worse. CARE ADVICE given per Hoarseness (Adult) guideline.   After Care Instructions Given     Call Event Type User Date / Time Description        attempted to call patients daughter x2 to make sure patient  is usually confused and to make sure mom understood directions.  was unable to reach daughter

## 2014-12-30 ENCOUNTER — Other Ambulatory Visit: Payer: Self-pay | Admitting: Family Medicine

## 2015-01-06 DIAGNOSIS — Z961 Presence of intraocular lens: Secondary | ICD-10-CM | POA: Diagnosis not present

## 2015-01-06 DIAGNOSIS — H04123 Dry eye syndrome of bilateral lacrimal glands: Secondary | ICD-10-CM | POA: Diagnosis not present

## 2015-01-22 ENCOUNTER — Other Ambulatory Visit (INDEPENDENT_AMBULATORY_CARE_PROVIDER_SITE_OTHER): Payer: Medicare Other

## 2015-01-22 ENCOUNTER — Ambulatory Visit (INDEPENDENT_AMBULATORY_CARE_PROVIDER_SITE_OTHER): Payer: Medicare Other

## 2015-01-22 DIAGNOSIS — R5383 Other fatigue: Secondary | ICD-10-CM

## 2015-01-22 DIAGNOSIS — E538 Deficiency of other specified B group vitamins: Secondary | ICD-10-CM

## 2015-01-22 DIAGNOSIS — R5381 Other malaise: Secondary | ICD-10-CM

## 2015-01-22 LAB — CBC WITH DIFFERENTIAL/PLATELET
Basophils Absolute: 0 10*3/uL (ref 0.0–0.1)
Basophils Relative: 0.4 % (ref 0.0–3.0)
EOS ABS: 0.1 10*3/uL (ref 0.0–0.7)
EOS PCT: 1.5 % (ref 0.0–5.0)
HCT: 38.1 % (ref 36.0–46.0)
HEMOGLOBIN: 12.8 g/dL (ref 12.0–15.0)
Lymphocytes Relative: 32.8 % (ref 12.0–46.0)
Lymphs Abs: 1.7 10*3/uL (ref 0.7–4.0)
MCHC: 33.5 g/dL (ref 30.0–36.0)
MCV: 88.7 fl (ref 78.0–100.0)
MONO ABS: 0.4 10*3/uL (ref 0.1–1.0)
Monocytes Relative: 7.8 % (ref 3.0–12.0)
Neutro Abs: 3 10*3/uL (ref 1.4–7.7)
Neutrophils Relative %: 57.5 % (ref 43.0–77.0)
PLATELETS: 168 10*3/uL (ref 150.0–400.0)
RBC: 4.3 Mil/uL (ref 3.87–5.11)
RDW: 14.1 % (ref 11.5–15.5)
WBC: 5.2 10*3/uL (ref 4.0–10.5)

## 2015-01-22 LAB — TSH: TSH: 0.04 u[IU]/mL — ABNORMAL LOW (ref 0.35–4.50)

## 2015-01-22 MED ORDER — CYANOCOBALAMIN 1000 MCG/ML IJ SOLN
1000.0000 ug | Freq: Once | INTRAMUSCULAR | Status: AC
  Administered 2015-01-22: 1000 ug via INTRAMUSCULAR

## 2015-01-23 ENCOUNTER — Other Ambulatory Visit: Payer: Self-pay | Admitting: Family Medicine

## 2015-01-23 MED ORDER — LEVOTHYROXINE SODIUM 100 MCG PO TABS: 100.0000 ug | ORAL_TABLET | Freq: Every day | ORAL | Status: DC

## 2015-01-28 DIAGNOSIS — Z1231 Encounter for screening mammogram for malignant neoplasm of breast: Secondary | ICD-10-CM | POA: Diagnosis not present

## 2015-02-07 ENCOUNTER — Encounter: Payer: Self-pay | Admitting: Internal Medicine

## 2015-02-07 ENCOUNTER — Ambulatory Visit (INDEPENDENT_AMBULATORY_CARE_PROVIDER_SITE_OTHER): Payer: Medicare Other | Admitting: Internal Medicine

## 2015-02-07 VITALS — BP 116/78 | HR 80 | Temp 98.0°F | Wt 136.0 lb

## 2015-02-07 DIAGNOSIS — M7521 Bicipital tendinitis, right shoulder: Secondary | ICD-10-CM

## 2015-02-07 NOTE — Progress Notes (Signed)
Subjective:    Patient ID: Monica Edwards, female    DOB: [birth year], 74 y.o.   MRN: [id number]  HPI  Pt presents to the clinic today with c/o shoulder pain. She reports this started 2 days ago. The pain radiates down her bicep. She reports the pain is sore and achy but more intense with certain movements. She denies any injury to the area but reports she has been digging and gardening in the yard earlier this week. She has not tried anything OTC. She is concerned that she Debes have torn her rotator cuff and is requesting to have an xray.  Review of Systems      Past Medical History  Diagnosis Date  . Allergy   . Depression   . Thyroid disease   . Osteoporosis 2006    Current Outpatient Prescriptions  Medication Sig Dispense Refill  . Biotin 5000 MCG CAPS Take 1 capsule by mouth daily.      . Calcium Carbonate-Vit D-Min 600-400 MG-UNIT TABS Take 1 tablet by mouth daily.      . cholecalciferol (VITAMIN D) 1000 UNITS tablet Take 1,000 Units by mouth 3 (three) times daily.    . cyanocobalamin (,VITAMIN B-12,) 1000 MCG/ML injection Inject 1,000 mcg into the muscle every 30 (thirty) days.    Marland Kitchen. escitalopram (LEXAPRO) 20 MG tablet Take 1 tablet (20 mg total) by mouth daily. 30 tablet 6  . fish oil-omega-3 fatty acids 1000 MG capsule Take 1 g by mouth 3 (three) times daily.      Marland Kitchen. levothyroxine (SYNTHROID, LEVOTHROID) 100 MCG tablet Take 1 tablet (100 mcg total) by mouth daily. 90 tablet 3  . meclizine (ANTIVERT) 25 MG tablet Take 25 mg by mouth 3 (three) times daily as needed.    . Multiple Vitamin (MULTIVITAMIN PO) Take 1 tablet by mouth daily.      . ranitidine (ZANTAC) 150 MG tablet TAKE 1 TABLET BY MOUTH TWICE A DAY 180 tablet 2  . vitamin B-12 (CYANOCOBALAMIN) 1000 MCG tablet Take 1,000 mcg by mouth daily.    . vitamin E 400 UNIT capsule Take 400 Units by mouth 2 (two) times daily.     No current facility-administered medications for this visit.    Allergies  Allergen Reactions    . Alendronate Sodium     REACTION: intolerant  . Ezetimibe-Simvastatin     REACTION: side effects  . Pravastatin Sodium     REACTION: intolerant  . Relafen [Nabumetone]     Chest pain  . Risedronate Sodium     REACTION: intolerant    No family history on file.  History   Social History  . Marital Status: Married    Spouse Name: N/A  . Number of Children: N/A  . Years of Education: N/A   Occupational History  . Not on file.   Social History Main Topics  . Smoking status: Never Smoker   . Smokeless tobacco: Not on file  . Alcohol Use: No  . Drug Use: No  . Sexual Activity: Not on file   Other Topics Concern  . Not on file   Social History Narrative   Husband has dementia.     Daughter is HPOA- has living will.   Desires resuscitation but does not desire prolonged life support or feeding tubes.     Constitutional: Denies fever, malaise, fatigue, headache or abrupt weight changes.  Respiratory: Denies difficulty breathing, shortness of breath, cough or sputum production.   Cardiovascular: Denies chest pain, chest  tightness, palpitations or swelling in the hands or feet.  Musculoskeletal: Pt reports right shoulder pain. Denies difficulty with gait, muscle pain or joint swelling.  Skin: Denies redness, rashes, lesions or ulcercations.   No other specific complaints in a complete review of systems (except as listed in HPI above).  Objective:   Physical Exam  BP 116/78 mmHg  Pulse 80  Temp(Src) 98 F (36.7 C) (Oral)  Wt 136 lb (61.689 kg)  SpO2 98%  Wt Readings from Last 3 Encounters:  02/07/15 136 lb (61.689 kg)  12/16/14 141 lb (63.957 kg)  08/28/14 139 lb (63.05 kg)    General: Appears her stated age, well developed, well nourished in NAD. Skin: Warm, dry and intact.  Cardiovascular: Normal rate and rhythm. S1,S2 noted.  No murmur, rubs or gallops noted.  Pulmonary/Chest: Normal effort and positive vesicular breath sounds. No respiratory distress. No  wheezes, rales or ronchi noted.  Musculoskeletal: Normal internal and external rotation of the right shoulder. Some pain with external rotation. Pain with the anterior biceps tendon. Negative drop can test. Strength 5/5 BUE. Neurological: Alert and oriented. Sensation intact to BUE.  BMET    Component Value Date/Time   NA 140 12/16/2014 1041   K 4.0 12/16/2014 1041   CL 105 12/16/2014 1041   CO2 26 12/16/2014 1041   GLUCOSE 81 12/16/2014 1041   BUN 12 12/16/2014 1041   CREATININE 1.10 12/16/2014 1041   CREATININE 1.05 11/03/2012 1528   CALCIUM 9.7 12/16/2014 1041   GFRNONAA 47.36 07/06/2010 1450   GFRAA 58 04/09/2008 1057    Lipid Panel     Component Value Date/Time   CHOL 308* 12/17/2013 1504   TRIG 283.0* 12/17/2013 1504   HDL 33.80* 12/17/2013 1504   CHOLHDL 9 12/17/2013 1504   VLDL 56.6* 12/17/2013 1504   LDLCALC 218* 12/17/2013 1504    CBC    Component Value Date/Time   WBC 5.2 01/22/2015 0921   RBC 4.30 01/22/2015 0921   HGB 12.8 01/22/2015 0921   HCT 38.1 01/22/2015 0921   PLT 168.0 01/22/2015 0921   MCV 88.7 01/22/2015 0921   MCHC 33.5 01/22/2015 0921   RDW 14.1 01/22/2015 0921   LYMPHSABS 1.7 01/22/2015 0921   MONOABS 0.4 01/22/2015 0921   EOSABS 0.1 01/22/2015 0921   BASOSABS 0.0 01/22/2015 0921    Hgb A1C No results found for: HGBA1C       Assessment & Plan:   Biceps tendonitis, right:  Advised her to try Ibuprofen 400-600 mg TID prn with food Advised her to rest her arm, no more digging or gardening x 1 weeks Shoulder exercises given Discussed that I did not think this was her rotator cuff and how an xray would not be beneficial in this case.  RTC as needed or if symptoms persist or worsen

## 2015-02-07 NOTE — Patient Instructions (Signed)
Bicipital Tendonitis Bicipital tendonitis refers to redness, soreness, and swelling (inflammation) or irritation of the bicep tendon. The biceps muscle is located between the elbow and shoulder of the inner arm. The tendon heads, similar to pieces of rope, connect the bicep muscle to the shoulder socket. They are called short head and long head tendons. When tendonitis occurs, the long head tendon is inflamed and swollen, and Olson be thickened or partially torn.  Bicipital tendonitis can occur with other problems as well, such as arthritis in the shoulder or acromioclavicular joints, tears in the tendons, or other rotator cuff problems.  CAUSES  Overuse of of the arms for overhead activities is the major cause of tendonitis. Many athletes, such as swimmers, baseball players, and tennis players are prone to bicipital tendonitis. Jobs that require manual labor or routine chores, especially chores involving overhead activities can result in overuse and tendonitis. SYMPTOMS Symptoms Askin include:  Pain in and around the front of the shoulder. Pain Saks be worse with overhead motion.  Pain or aching that radiates down the arm.  Clicking or shifting sensations in the shoulder. DIAGNOSIS Your caregiver Reliford perform the following:  Physical exam and tests of the biceps and shoulder to observe range of motion, strength, and stability.  X-rays or magnetic resonance imaging (MRI) to confirm the diagnosis. In most common cases, these tests are not necessary. Since other problems Brouillet exist in the shoulder or rotator cuff, additional tests Daugherty be recommended. TREATMENT Treatment Wolfinger include the following:  Medications  Your caregiver Borden prescribe over-the-counter pain relievers.  Steroid injections, such as cortisone, Low be recommended. These Stults help to reduce inflammation and pain.  Physical Therapy - Your caregiver Ram recommend gentle exercises with the arm. These can help restore strength and range  of motion. They Bennie be done at home or with a physical therapist's supervision and input.  Surgery - Arthroscopic or open surgery sometimes is necessary. Surgery Miguez include:  Reattachment or repair of the tendon at the shoulder socket.  Removal of the damaged section of the tendon.  Anchoring the tendon to a different area of the shoulder (tenodesis). HOME CARE INSTRUCTIONS   Avoid overhead motion of the affected arm or any other motion that causes pain.  Take medication for pain as directed. Do not take these for more than 3 weeks, unless directed to do so by your caregiver.  Ice the affected area for 20 minutes at a time, 3-4 times per day. Place a towel on the skin over the painful area and the ice or cold pack over the towel. Do not place ice directly on the skin.  Perform gentle exercises at home as directed. These will increase strength and flexibility. PREVENTION  Modify your activities as much as possible to protect your arm. A physical therapist or sports medicine physician can help you understand options for safe motion.  Avoid repetitive overhead pulling, lifting, reaching, and throwing until your caregiver tells you it is ok to resume these activities. SEEK MEDICAL CARE IF:  Your pain worsens.  You have difficulty moving the affected arm.  You have trouble performing any of the self-care instructions. MAKE SURE YOU:   Understand these instructions.  Will watch your condition.  Will get help right away if you are not doing well or get worse. Document Released: 11/06/2010 Document Revised: 12/27/2011 Document Reviewed: 11/06/2010 ExitCare Patient Information 2015 [employer]. This information is not intended to replace advice given to you by your health care provider.   Make sure you discuss any questions you have with your health care provider.  

## 2015-02-07 NOTE — Progress Notes (Signed)
Pre visit review using our clinic review tool, if applicable. No additional management support is needed unless otherwise documented below in the visit note. 

## 2015-02-21 ENCOUNTER — Ambulatory Visit: Payer: Self-pay

## 2015-02-26 ENCOUNTER — Ambulatory Visit (INDEPENDENT_AMBULATORY_CARE_PROVIDER_SITE_OTHER): Payer: Medicare Other

## 2015-02-26 DIAGNOSIS — E538 Deficiency of other specified B group vitamins: Secondary | ICD-10-CM

## 2015-02-26 MED ORDER — CYANOCOBALAMIN 1000 MCG/ML IJ SOLN
1000.0000 ug | Freq: Once | INTRAMUSCULAR | Status: AC
  Administered 2015-02-26: 1000 ug via INTRAMUSCULAR

## 2015-03-12 DIAGNOSIS — R1013 Epigastric pain: Secondary | ICD-10-CM | POA: Diagnosis not present

## 2015-03-12 DIAGNOSIS — E039 Hypothyroidism, unspecified: Secondary | ICD-10-CM | POA: Diagnosis not present

## 2015-03-12 DIAGNOSIS — R413 Other amnesia: Secondary | ICD-10-CM | POA: Diagnosis not present

## 2015-03-12 DIAGNOSIS — E559 Vitamin D deficiency, unspecified: Secondary | ICD-10-CM | POA: Diagnosis not present

## 2015-03-12 DIAGNOSIS — E538 Deficiency of other specified B group vitamins: Secondary | ICD-10-CM | POA: Diagnosis not present

## 2015-03-12 DIAGNOSIS — M25511 Pain in right shoulder: Secondary | ICD-10-CM | POA: Diagnosis not present

## 2015-03-18 ENCOUNTER — Telehealth: Payer: Self-pay | Admitting: Family Medicine

## 2015-03-18 NOTE — Telephone Encounter (Signed)
Patient cancelled her lab appointment for tomorrow.  Patient said her daughter wants her to switch to a doctor close to her daughter's work.

## 2015-03-19 ENCOUNTER — Other Ambulatory Visit: Payer: Medicare Other

## 2015-04-09 DIAGNOSIS — M25511 Pain in right shoulder: Secondary | ICD-10-CM | POA: Diagnosis not present

## 2015-04-09 DIAGNOSIS — E559 Vitamin D deficiency, unspecified: Secondary | ICD-10-CM | POA: Diagnosis not present

## 2015-04-09 DIAGNOSIS — M12819 Other specific arthropathies, not elsewhere classified, unspecified shoulder: Secondary | ICD-10-CM | POA: Diagnosis not present

## 2015-04-09 DIAGNOSIS — E538 Deficiency of other specified B group vitamins: Secondary | ICD-10-CM | POA: Diagnosis not present

## 2015-04-09 DIAGNOSIS — E039 Hypothyroidism, unspecified: Secondary | ICD-10-CM | POA: Diagnosis not present

## 2015-04-09 DIAGNOSIS — R1013 Epigastric pain: Secondary | ICD-10-CM | POA: Diagnosis not present

## 2015-04-24 ENCOUNTER — Encounter: Payer: Self-pay | Admitting: *Deleted

## 2015-05-06 ENCOUNTER — Telehealth: Payer: Self-pay | Admitting: Family Medicine

## 2015-05-06 NOTE — Telephone Encounter (Signed)
Monica Edwards, please call pt to check on her.  I am not here this afternoon, but I would be happy to see her tomorrow,.

## 2015-05-06 NOTE — Telephone Encounter (Signed)
Pt called (she was crying and her voice shakey) wanting to see you.  She says her nerves are shot and she needs help.  I have her scheduled for Thursday at 11:30 a.m. But I didn't know if you wanted to see if you could work her in sooner or if you thought another provider should see her prior to Thursday.  I walked back there, but you were away from your office.  Please advise.

## 2015-05-06 NOTE — Telephone Encounter (Signed)
Spoke to pt who states "everything is all right" and she'll be ok until her appt

## 2015-05-08 ENCOUNTER — Ambulatory Visit: Payer: Self-pay | Admitting: Family Medicine

## 2015-05-09 DIAGNOSIS — F331 Major depressive disorder, recurrent, moderate: Secondary | ICD-10-CM | POA: Diagnosis not present

## 2015-05-09 DIAGNOSIS — M25511 Pain in right shoulder: Secondary | ICD-10-CM | POA: Diagnosis not present

## 2015-05-09 DIAGNOSIS — R4182 Altered mental status, unspecified: Secondary | ICD-10-CM | POA: Diagnosis not present

## 2015-05-12 ENCOUNTER — Telehealth: Payer: Self-pay

## 2015-05-12 NOTE — Telephone Encounter (Signed)
Called and spoke with patient, and notified them that they were due for a Mammogram. Patient states that she already had a Mammogram in February at [facility]. Patient states that the results came back normal.

## 2015-05-26 ENCOUNTER — Other Ambulatory Visit: Payer: Self-pay | Admitting: Internal Medicine

## 2015-05-26 ENCOUNTER — Encounter: Payer: Self-pay | Admitting: *Deleted

## 2015-05-26 ENCOUNTER — Ambulatory Visit
Admission: RE | Admit: 2015-05-26 | Discharge: 2015-05-26 | Disposition: A | Discharge status: Home / Self Care | Payer: Medicare Other | Source: Ambulatory Visit | Attending: Internal Medicine | Admitting: Internal Medicine

## 2015-05-26 DIAGNOSIS — M25511 Pain in right shoulder: Secondary | ICD-10-CM

## 2015-05-26 DIAGNOSIS — R1013 Epigastric pain: Secondary | ICD-10-CM | POA: Diagnosis not present

## 2015-05-26 DIAGNOSIS — M12819 Other specific arthropathies, not elsewhere classified, unspecified shoulder: Secondary | ICD-10-CM | POA: Diagnosis not present

## 2015-05-26 DIAGNOSIS — F329 Major depressive disorder, single episode, unspecified: Secondary | ICD-10-CM | POA: Diagnosis not present

## 2015-05-26 DIAGNOSIS — R413 Other amnesia: Secondary | ICD-10-CM | POA: Diagnosis not present

## 2015-06-06 ENCOUNTER — Telehealth: Payer: Self-pay

## 2015-06-06 ENCOUNTER — Telehealth: Payer: Self-pay | Admitting: Family Medicine

## 2015-06-06 NOTE — Telephone Encounter (Signed)
Pt walked in requesting refill status of lexapro; pt thinks she spoke with someone this morning about refilling med but does not remember who she spoke with and then pt said she might have spoke with someone at pharmacy. I [place] spoke with Monica Edwards and he said pt has refill of lexapro available but is 1 week early to pick up. Pt said her daughter fixed her med box weekly and pt will ck with her daughter about the Lexapro and cb if needed. Pt seems slightly confused; I called pts daughter Monica Edwards (DPR signed) and Monica Edwards does fix med box for pt and Monica Edwards will order refill next week. Monica Edwards said pt can get confused and she will call pt and let her know Monica Edwards will order refill next week when needed. Monica Edwards appreciative of call and will call [facility] if needed; Monica Edwards said pt is getting little more forgetful. Spoke with Monica Edwards and she said to chart info and close note since pts daughter will cb if needed.

## 2015-06-06 NOTE — Telephone Encounter (Signed)
[facility] Day - Client TELEPHONE ADVICE RECORD [facility]  Patient Name: Monica Edwards  DOB: 03-17-1941    Initial Comment Caller states wants something for nerves called in; is itching, nerves are shot;    Nurse Assessment  Nurse: Roderic Ovens, RN, Amy Date/Time (Eastern Time): 06/06/2015 3:08:54 PM  Confirm and document reason for call. If symptomatic, describe symptoms. ---CALLER STATES THAT SHE IS HAVING SOME PROBLEMS WITH HER NERVES. SHE STATES THAT SHE HAS BEEN HAVING PROBLEMS WITH HER HUSBAND HAS BEEN PUT IN A NURSING HOME AND IT STEMS FROM THIS. NO RASH NOTED ON HER LEGS - THAT IS WHERE SHE IS COMPLAINING OF ITCHING. SHE STATES THAT HER NERVES ARE SHOT.  Has the patient traveled out of the country within the last 30 days? ---Not Applicable  Does the patient require triage? ---Yes  Related visit to physician within the last 2 weeks? ---No  Does the PT have any chronic conditions? (i.e. diabetes, asthma, etc.) ---No     Guidelines    Guideline Title Affirmed Question Affirmed Notes  Anxiety and Panic Attack Patient sounds very sick or weak to the triager please see notes   Final Disposition User   Go to ED Now (or PCP triage) [place], RN, Amy    Comments  SHE JUST WANTS SOMETHING FOR HER NERVES SHE STATES. SHE HAS NEVER BEEN SEEN FOR THIS BEFORE. SHE IS WILLING TO GO IN FOR AN APPT ABOUT THIS. IT IS RELATED TO HER HUSBAND HAVING TO GO INTO THE NURSING HOME. SHE AGREED TO AN APPT ON MONDAY. SHE ALSO AGREED THAT IF SHE HAS ANY PROBLEMS THAT SHE WILL CALL BEFORE THEN.   Referrals  REFERRED TO PCP OFFICE   Disagree/Comply: Comply

## 2015-06-06 NOTE — Telephone Encounter (Signed)
Duplicate/error

## 2015-06-06 NOTE — Telephone Encounter (Signed)
This is FYI for Dr Dayton Martes. Please see 06/06/15 phone note for med refill done earlier today. Pt has appt to see Dr Dayton Martes on 06/09/15 at 3 PM.

## 2015-06-09 ENCOUNTER — Ambulatory Visit: Payer: Medicare Other | Admitting: Family Medicine

## 2015-06-09 DIAGNOSIS — Z0289 Encounter for other administrative examinations: Secondary | ICD-10-CM

## 2015-06-09 NOTE — Telephone Encounter (Signed)
Pt did not come in for their appt today for an acute visit. Please let me know if pt needs to be contacted immediately for follow up or no follow up needed. Best phone number to contact pt is [phone].

## 2015-06-10 NOTE — Telephone Encounter (Signed)
Yes if she is still having anxiety, she should be seen.

## 2015-06-12 NOTE — Telephone Encounter (Signed)
Spoke with pt, she stated she does not need to be seen at this time and will call back for an appt if she needs one.

## 2015-07-14 ENCOUNTER — Other Ambulatory Visit: Payer: Self-pay | Admitting: Family Medicine

## 2015-07-15 DIAGNOSIS — M12819 Other specific arthropathies, not elsewhere classified, unspecified shoulder: Secondary | ICD-10-CM | POA: Diagnosis not present

## 2015-07-15 DIAGNOSIS — M25511 Pain in right shoulder: Secondary | ICD-10-CM | POA: Diagnosis not present

## 2015-07-15 DIAGNOSIS — Z0001 Encounter for general adult medical examination with abnormal findings: Secondary | ICD-10-CM | POA: Diagnosis not present

## 2015-07-15 DIAGNOSIS — R413 Other amnesia: Secondary | ICD-10-CM | POA: Diagnosis not present

## 2015-07-15 DIAGNOSIS — E039 Hypothyroidism, unspecified: Secondary | ICD-10-CM | POA: Diagnosis not present

## 2015-07-15 DIAGNOSIS — Z23 Encounter for immunization: Secondary | ICD-10-CM | POA: Diagnosis not present

## 2015-07-15 DIAGNOSIS — F329 Major depressive disorder, single episode, unspecified: Secondary | ICD-10-CM | POA: Diagnosis not present

## 2015-08-20 IMAGING — CR DG SHOULDER 2+V*R*
3 series · 3 of 3 positions shown · non-contrast
Comparison: None.

CLINICAL DATA: Chronic pain, no trauma

EXAM:
RIGHT SHOULDER - 2+ VIEW

[w shoulder ap internal righ]
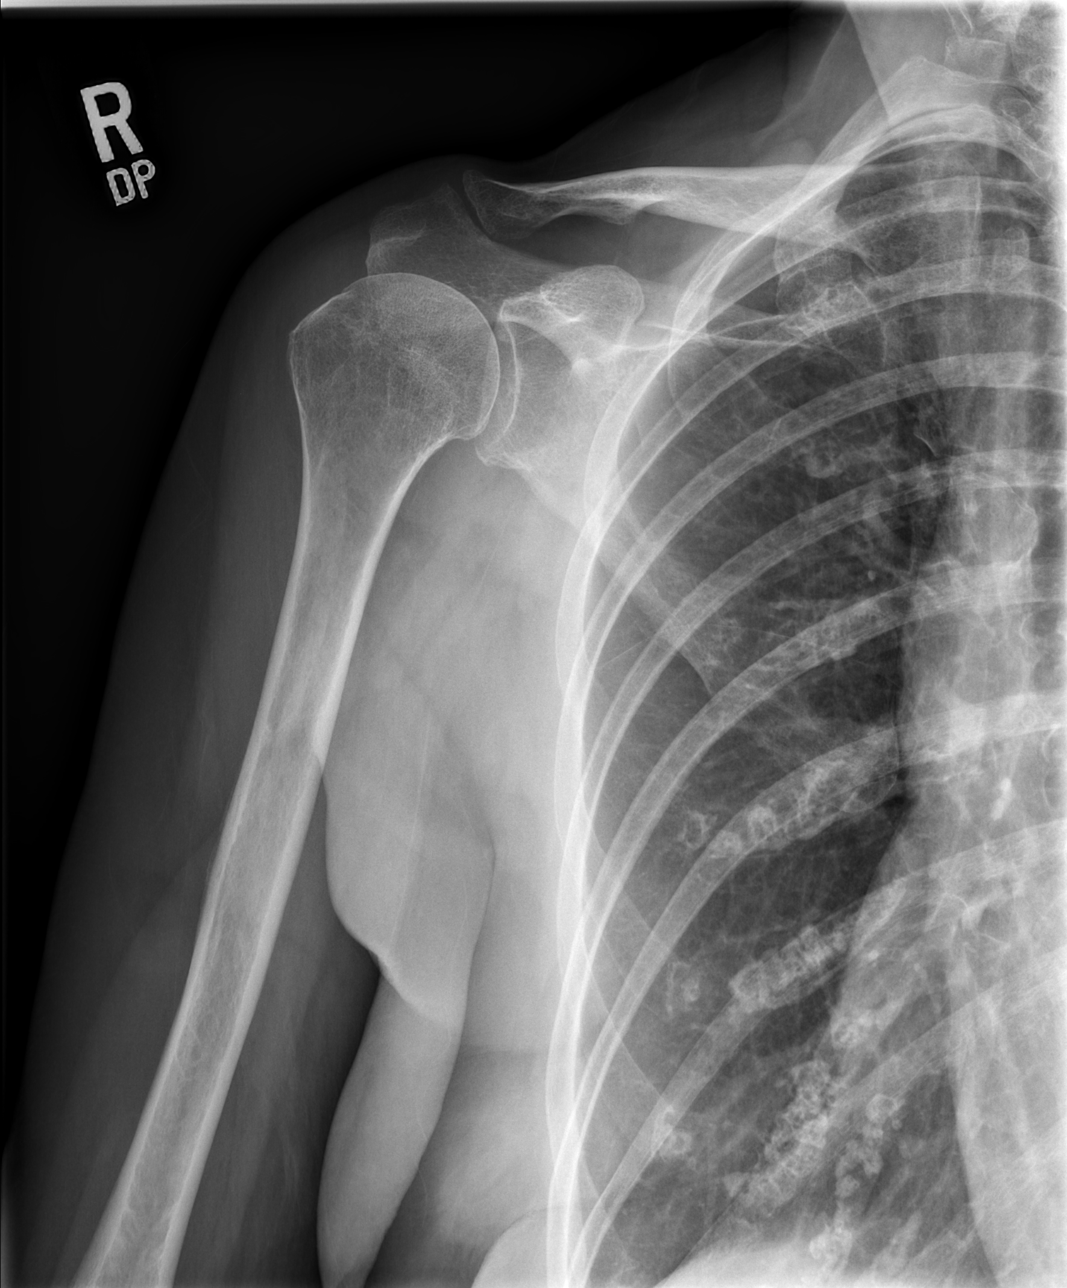

[w shoulder y view right]
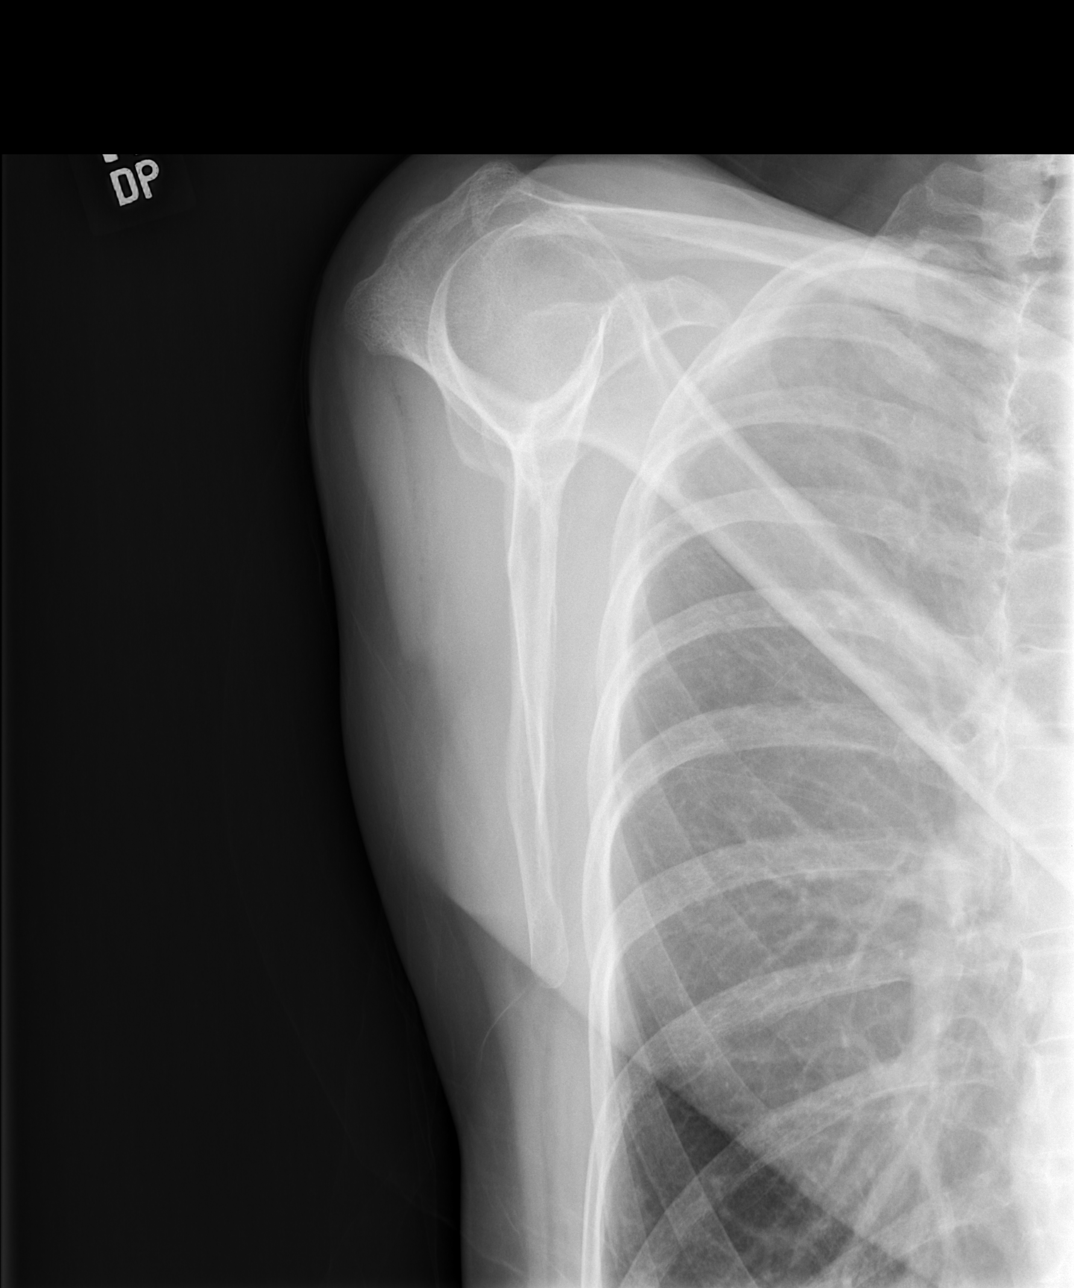

[w shoulder axillary right *]
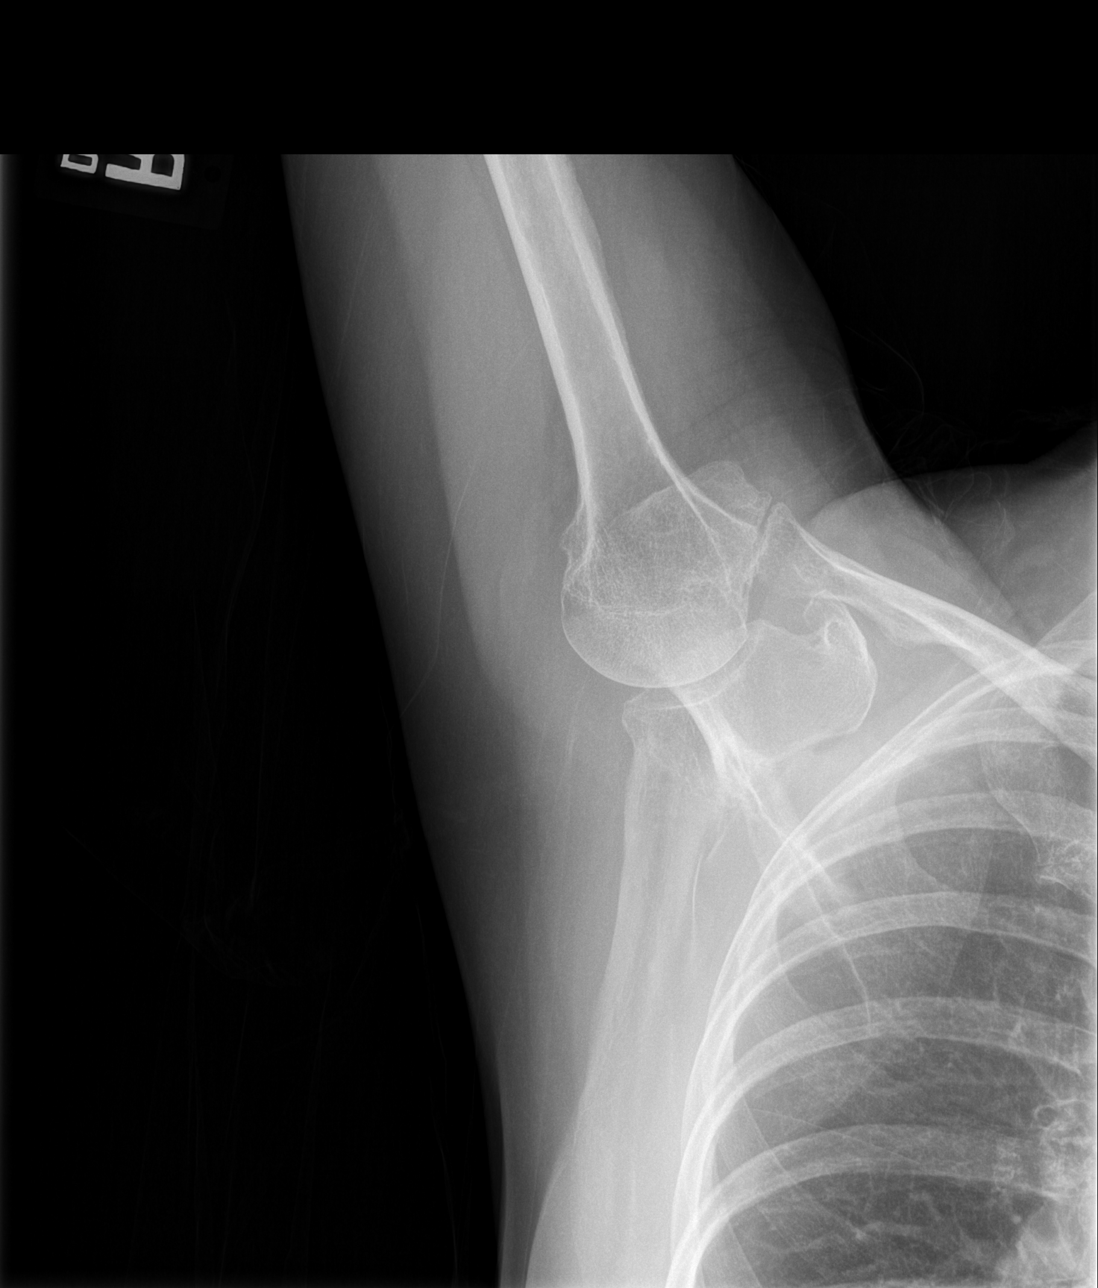

[3 of 3 positions shown; findings below may reference images not displayed]

FINDINGS: The right shoulder joint space is within normal limits for age. No
acute abnormality is seen. Minimal acromial spurring is present. The
right AC joint is normally aligned.
IMPRESSION: No acute abnormality.  Minimal acromial spurring.

## 2015-12-21 ENCOUNTER — Other Ambulatory Visit: Payer: Self-pay | Admitting: Family Medicine

## 2015-12-22 ENCOUNTER — Encounter (HOSPITAL_COMMUNITY): Payer: Self-pay

## 2015-12-22 ENCOUNTER — Emergency Department (HOSPITAL_COMMUNITY)
Admission: EM | Admit: 2015-12-22 | Discharge: 2015-12-23 | Disposition: A | Discharge status: Home / Self Care | Payer: Medicare Other | Attending: Emergency Medicine | Admitting: Emergency Medicine

## 2015-12-22 ENCOUNTER — Emergency Department (HOSPITAL_COMMUNITY): Payer: Medicare Other

## 2015-12-22 DIAGNOSIS — M810 Age-related osteoporosis without current pathological fracture: Secondary | ICD-10-CM | POA: Diagnosis not present

## 2015-12-22 DIAGNOSIS — E079 Disorder of thyroid, unspecified: Secondary | ICD-10-CM | POA: Insufficient documentation

## 2015-12-22 DIAGNOSIS — R112 Nausea with vomiting, unspecified: Secondary | ICD-10-CM | POA: Diagnosis not present

## 2015-12-22 DIAGNOSIS — R410 Disorientation, unspecified: Secondary | ICD-10-CM

## 2015-12-22 DIAGNOSIS — E860 Dehydration: Secondary | ICD-10-CM | POA: Insufficient documentation

## 2015-12-22 DIAGNOSIS — F329 Major depressive disorder, single episode, unspecified: Secondary | ICD-10-CM | POA: Diagnosis not present

## 2015-12-22 DIAGNOSIS — Z79899 Other long term (current) drug therapy: Secondary | ICD-10-CM | POA: Insufficient documentation

## 2015-12-22 DIAGNOSIS — F0390 Unspecified dementia without behavioral disturbance: Secondary | ICD-10-CM | POA: Insufficient documentation

## 2015-12-22 DIAGNOSIS — R4182 Altered mental status, unspecified: Secondary | ICD-10-CM | POA: Insufficient documentation

## 2015-12-22 DIAGNOSIS — F419 Anxiety disorder, unspecified: Secondary | ICD-10-CM | POA: Insufficient documentation

## 2015-12-22 DIAGNOSIS — R1110 Vomiting, unspecified: Secondary | ICD-10-CM | POA: Diagnosis not present

## 2015-12-22 HISTORY — DX: Unspecified dementia, unspecified severity, without behavioral disturbance, psychotic disturbance, mood disturbance, and anxiety: F03.90

## 2015-12-22 LAB — COMPREHENSIVE METABOLIC PANEL
ALBUMIN: 4.4 g/dL (ref 3.5–5.0)
ALT: 19 U/L (ref 14–54)
ANION GAP: 15 (ref 5–15)
AST: 30 U/L (ref 15–41)
Alkaline Phosphatase: 54 U/L (ref 38–126)
BUN: 17 mg/dL (ref 6–20)
CHLORIDE: 105 mmol/L (ref 101–111)
CO2: 19 mmol/L — AB (ref 22–32)
Calcium: 9.7 mg/dL (ref 8.9–10.3)
Creatinine, Ser: 1.48 mg/dL — ABNORMAL HIGH (ref 0.44–1.00)
GFR calc Af Amer: 39 mL/min — ABNORMAL LOW (ref >60)
GFR calc non Af Amer: 34 mL/min — ABNORMAL LOW (ref >60)
GLUCOSE: 118 mg/dL — AB (ref 65–99)
POTASSIUM: 3.4 mmol/L — AB (ref 3.5–5.1)
SODIUM: 139 mmol/L (ref 135–145)
Total Bilirubin: 1 mg/dL (ref 0.3–1.2)
Total Protein: 7.5 g/dL (ref 6.5–8.1)

## 2015-12-22 LAB — CBC WITH DIFFERENTIAL/PLATELET
BASOS ABS: 0 10*3/uL (ref 0.0–0.1)
Basophils Relative: 0 %
EOS ABS: 0 10*3/uL (ref 0.0–0.7)
Eosinophils Relative: 0 %
HCT: 40 % (ref 36.0–46.0)
Hemoglobin: 13.2 g/dL (ref 12.0–15.0)
LYMPHS ABS: 1 10*3/uL (ref 0.7–4.0)
LYMPHS PCT: 14 %
MCH: 29.9 pg (ref 26.0–34.0)
MCHC: 33 g/dL (ref 30.0–36.0)
MCV: 90.5 fL (ref 78.0–100.0)
MONOS PCT: 3 %
Monocytes Absolute: 0.2 10*3/uL (ref 0.1–1.0)
NEUTROS ABS: 5.8 10*3/uL (ref 1.7–7.7)
Neutrophils Relative %: 83 %
Platelets: 202 10*3/uL (ref 150–400)
RBC: 4.42 MIL/uL (ref 3.87–5.11)
RDW: 13.5 % (ref 11.5–15.5)
WBC: 7 10*3/uL (ref 4.0–10.5)

## 2015-12-22 LAB — URINALYSIS, ROUTINE W REFLEX MICROSCOPIC
Bilirubin Urine: NEGATIVE
GLUCOSE, UA: NEGATIVE mg/dL
Hgb urine dipstick: NEGATIVE
Ketones, ur: 15 mg/dL — AB
LEUKOCYTES UA: NEGATIVE
Nitrite: NEGATIVE
PH: 8 (ref 5.0–8.0)
PROTEIN: NEGATIVE mg/dL
SPECIFIC GRAVITY, URINE: 1.016 (ref 1.005–1.030)

## 2015-12-22 LAB — I-STAT CG4 LACTIC ACID, ED: Lactic Acid, Venous: 2.79 mmol/L (ref 0.5–2.0)

## 2015-12-22 LAB — I-STAT TROPONIN, ED: TROPONIN I, POC: 0 ng/mL (ref 0.00–0.08)

## 2015-12-22 LAB — CBG MONITORING, ED: GLUCOSE-CAPILLARY: 109 mg/dL — AB (ref 65–99)

## 2015-12-22 MED ORDER — SODIUM CHLORIDE 0.9 % IV BOLUS (SEPSIS)
1000.0000 mL | Freq: Once | INTRAVENOUS | Status: AC
  Administered 2015-12-22: 1000 mL via INTRAVENOUS

## 2015-12-22 NOTE — Telephone Encounter (Signed)
Pt NSH last 2 appts. Per Dr Dayton MartesAron. OV required

## 2015-12-22 NOTE — Telephone Encounter (Signed)
Spoke to pt and offered to schedule appt. Pt declined.

## 2015-12-22 NOTE — ED Notes (Signed)
MD at bedside. 

## 2015-12-22 NOTE — ED Notes (Signed)
Patient transported to CT 

## 2015-12-22 NOTE — ED Notes (Signed)
Pt arrived via EMS with a c/o N&V, weakness x 1 day. EMS also states family told them that the pt has been mildly confused. EMS gave 4mg  of Zofran IV for nausea.

## 2015-12-23 LAB — I-STAT CG4 LACTIC ACID, ED: Lactic Acid, Venous: 1.27 mmol/L (ref 0.5–2.0)

## 2015-12-23 NOTE — Discharge Instructions (Signed)
Confusion Confusion is the inability to think with your usual speed or clarity. Confusion Schimming come on quickly or slowly over time. How quickly the confusion comes on depends on the cause. Confusion can be due to any number of causes. CAUSES   Concussion, head injury, or head trauma.  Seizures.  Stroke.  Fever.  Brain tumor.  Age related decreased brain function (dementia).  Heightened emotional states like rage or terror.  Mental illness in which the person loses the ability to determine what is real and what is not (hallucinations).  Infections such as a urinary tract infection (UTI).  Toxic effects from alcohol, drugs, or prescription medicines.  Dehydration and an imbalance of salts in the body (electrolytes).  Lack of sleep.  Low blood sugar (diabetes).  Low levels of oxygen from conditions such as chronic lung disorders.  Drug interactions or other medicine side effects.  Nutritional deficiencies, especially niacin, thiamine, vitamin C, or vitamin B.  Sudden drop in body temperature (hypothermia).  Change in routine, such as when traveling or hospitalized. SIGNS AND SYMPTOMS  People often describe their thinking as cloudy or unclear when they are confused. Confusion can also include feeling disoriented. That means you are unaware of where or who you are. You Marina also not know what the date or time is. If confused, you Cottone also have difficulty paying attention, remembering, and making decisions. Some people also act aggressively when they are confused.  DIAGNOSIS  The medical evaluation of confusion Peaster include:  Blood and urine tests.  X-rays.  Brain and nervous system tests.  Analyzing your brain waves (electroencephalogram or EEG).  Magnetic resonance imaging (MRI) of your head.  Computed tomography (CT) scan of your head.  Mental status tests in which your health care provider Graf ask many questions. Some of these questions Gentz seem silly or strange,  but they are a very important test to help diagnose and treat confusion. TREATMENT  An admission to the hospital Labreck not be needed, but a person with confusion should not be left alone. Stay with a family member or friend until the confusion clears. Avoid alcohol, pain relievers, or sedative drugs until you have fully recovered. Do not drive until directed by your health care provider. HOME CARE INSTRUCTIONS  What family and friends can do:  To find out if someone is confused, ask the person to state his or her name, age, and the date. If the person is unsure or answers incorrectly, he or she is confused.  Always introduce yourself, no matter how well the person knows you.  Often remind the person of his or her location.  Place a calendar and clock near the confused person.  Help the person with his or her medicines. You Geathers want to use a pill box, an alarm as a reminder, or give the person each dose as prescribed.  Talk about current events and plans for the day.  Try to keep the environment calm, quiet, and peaceful.  Make sure the person keeps follow-up visits with his or her health care provider. PREVENTION  Ways to prevent confusion:  Avoid alcohol.  Eat a balanced diet.  Get enough sleep.  Take medicine only as directed by your health care provider.  Do not become isolated. Spend time with other people and make plans for your days.  Keep careful watch on your blood sugar levels if you are diabetic. SEEK IMMEDIATE MEDICAL CARE IF:   You develop severe headaches, repeated vomiting, seizures, blackouts, or   slurred speech.  There is increasing confusion, weakness, numbness, restlessness, or personality changes.  You develop a loss of balance, have marked dizziness, feel uncoordinated, or fall.  You have delusions, hallucinations, or develop severe anxiety.  Your family members think you need to be rechecked.   This information is not intended to replace advice given  to you by your health care provider. Make sure you discuss any questions you have with your health care provider.   Document Released: 11/11/2004 Document Revised: 10/25/2014 Document Reviewed: 11/09/2013 Elsevier Interactive Patient Education 2016 Elsevier Inc.  

## 2015-12-23 NOTE — ED Provider Notes (Signed)
CSN: [id number]     Arrival date & time 12/22/15  2045 History   First MD Initiated Contact with Patient 12/22/15 2056     Chief Complaint  Patient presents with  . Emesis     (Consider location/radiation/quality/duration/timing/severity/associated sxs/prior Treatment) Patient is a 75 y.o. female presenting with altered mental status. The history is provided by a relative.  Altered Mental Status Presenting symptoms: confusion (and anxiety)   Severity:  Moderate Most recent episode:  Today Episode history:  Single Duration:  1 day Timing:  Constant Progression:  Improving Chronicity:  New Context: dementia (suspected progressing )   Associated symptoms: vomiting (gagging when found by family in hyterics)   Associated symptoms: no fever and no slurred speech     Past Medical History  Diagnosis Date  . Allergy   . Depression   . Thyroid disease   . Osteoporosis 2006  . Dementia    Past Surgical History  Procedure Laterality Date  . Brain surgery angionoma  1988  . Cholecystectomy  06/15/2004  . Spondylosis  08/26/2003    L-S   No family history on file. Social History  Substance Use Topics  . Smoking status: Never Smoker   . Smokeless tobacco: None  . Alcohol Use: No   OB History    No data available     Review of Systems  Constitutional: Negative for fever.  Gastrointestinal: Positive for vomiting (gagging when found by family in hyterics).  Psychiatric/Behavioral: Positive for confusion (and anxiety).  All other systems reviewed and are negative.     Allergies  Alendronate sodium; Ezetimibe-simvastatin; Pravastatin sodium; Relafen; and Risedronate sodium  Home Medications   Prior to Admission medications   Medication Sig Start Date End Date Taking? Authorizing Provider  Biotin 5000 MCG CAPS Take 1 capsule by mouth daily.      Historical Provider, MD  Calcium Carbonate-Vit D-Min 600-400 MG-UNIT TABS Take 1 tablet by mouth daily.      Historical  Provider, MD  cholecalciferol (VITAMIN D) 1000 UNITS tablet Take 1,000 Units by mouth 3 (three) times daily.    Historical Provider, MD  cyanocobalamin (,VITAMIN B-12,) 1000 MCG/ML injection Inject 1,000 mcg into the muscle every 30 (thirty) days.    Historical Provider, MD  escitalopram (LEXAPRO) 20 MG tablet TAKE 1 TABLET BY MOUTH DAILY 07/15/15   Dianne Dunalia M Aron, MD  fish oil-omega-3 fatty acids 1000 MG capsule Take 1 g by mouth 3 (three) times daily.      Historical Provider, MD  levothyroxine (SYNTHROID, LEVOTHROID) 100 MCG tablet Take 1 tablet (100 mcg total) by mouth daily. 01/23/15   Dianne Dunalia M Aron, MD  meclizine (ANTIVERT) 25 MG tablet Take 25 mg by mouth 3 (three) times daily as needed.    Historical Provider, MD  Multiple Vitamin (MULTIVITAMIN PO) Take 1 tablet by mouth daily.      Historical Provider, MD  ranitidine (ZANTAC) 150 MG tablet TAKE 1 TABLET BY MOUTH TWICE A DAY 07/15/15   Dianne Dunalia M Aron, MD  vitamin B-12 (CYANOCOBALAMIN) 1000 MCG tablet Take 1,000 mcg by mouth daily.    Historical Provider, MD  vitamin E 400 UNIT capsule Take 400 Units by mouth 2 (two) times daily.    Historical Provider, MD   BP 155/75 mmHg  Pulse 61  Temp(Src) 98 F (36.7 C) (Oral)  Resp 20  SpO2 98% Physical Exam  Constitutional: She is oriented to person, place, and time. She appears well-developed and well-nourished. No distress.  HENT:  Head: Normocephalic.  Eyes: Conjunctivae are normal.  Neck: Neck supple. No tracheal deviation present.  Cardiovascular: Normal rate, regular rhythm and normal heart sounds.   Pulmonary/Chest: Effort normal and breath sounds normal. No respiratory distress.  Abdominal: Soft. She exhibits no distension. There is no tenderness.  Neurological: She is alert and oriented to person, place, and time. She has normal strength. No cranial nerve deficit or sensory deficit. Coordination normal. GCS eye subscore is 4. GCS verbal subscore is 5. GCS motor subscore is 6.  Normal finger  to nose testing and rapid alternating movement   Skin: Skin is warm and dry.  Psychiatric: She has a normal mood and affect.  Vitals reviewed.   ED Course  Procedures (including critical care time) Labs Review Labs Reviewed  COMPREHENSIVE METABOLIC PANEL - Abnormal; Notable for the following:    Potassium 3.4 (*)    CO2 19 (*)    Glucose, Bld 118 (*)    Creatinine, Ser 1.48 (*)    GFR calc non Af Amer 34 (*)    GFR calc Af Amer 39 (*)    All other components within normal limits  URINALYSIS, ROUTINE W REFLEX MICROSCOPIC (NOT AT [facility]) - Abnormal; Notable for the following:    Ketones, ur 15 (*)    All other components within normal limits  CBG MONITORING, ED - Abnormal; Notable for the following:    Glucose-Capillary 109 (*)    All other components within normal limits  I-STAT CG4 LACTIC ACID, ED - Abnormal; Notable for the following:    Lactic Acid, Venous 2.79 (*)    All other components within normal limits  CBC WITH DIFFERENTIAL/PLATELET  I-STAT TROPOININ, ED  I-STAT CG4 LACTIC ACID, ED    Imaging Review Ct Head Wo Contrast  12/22/2015  CLINICAL DATA:  Progressive confusion.  Brain surgery 6 years ago EXAM: CT HEAD WITHOUT CONTRAST TECHNIQUE: Contiguous axial images were obtained from the base of the skull through the vertex without intravenous contrast. COMPARISON:  None. FINDINGS: Left occipital craniotomy. There is encephalomalacia in the left cerebellum with calcification. Ventricle size normal. Patchy hypodensity in the cerebral white matter most consistent with chronic microvascular ischemia. Negative for acute infarct.  Negative for acute hemorrhage or mass. No acute abnormality of the skull. IMPRESSION: Postsurgical changes in the left cerebellum. Chronic microvascular ischemic change in the white matter. No acute intracranial abnormality. Electronically Signed   By: Marlan Palau M.D.   On: 12/22/2015 23:49   I have personally reviewed and evaluated these images and  lab results as part of my medical decision-making.   EKG Interpretation None      MDM   Final diagnoses:  Confusion  Mild dehydration   75 y.o. female presents with multiple months of progressive decline in memory and increasing confusion. Today she called her family concerned that she had not been able to find them, when they went to check on her she was anxious and delirious, began gagging, emotionally distressed. Now back to baseline with ongoing confusion. Labs c/w mild dehydration possibly secondary to poor po intake. Given IVF with resolution of lactic acid. Creatinine slightly elevated compared to prior but able to take po appropriately. CT without acute ischemic event and Pt has no deficits. Discussed with family that this Taddei be progression of dementia and she will be staying with a family member tonight. They agreed to arrange close f/u with PCP to redraw labs or return to the ED in 48 hours if unable. Pt  lives alone normally and [place] benefit for OP arrangement of home health services or placement if indicated. Plan to follow up with PCP as needed and return precautions discussed for worsening or new concerning symptoms.    Lyndal Pulley, MD 12/23/15 0300

## 2016-01-12 DIAGNOSIS — Z0001 Encounter for general adult medical examination with abnormal findings: Secondary | ICD-10-CM | POA: Diagnosis not present

## 2016-01-12 DIAGNOSIS — M12819 Other specific arthropathies, not elsewhere classified, unspecified shoulder: Secondary | ICD-10-CM | POA: Diagnosis not present

## 2016-01-12 DIAGNOSIS — E039 Hypothyroidism, unspecified: Secondary | ICD-10-CM | POA: Diagnosis not present

## 2016-01-12 DIAGNOSIS — R413 Other amnesia: Secondary | ICD-10-CM | POA: Diagnosis not present

## 2016-01-12 DIAGNOSIS — F329 Major depressive disorder, single episode, unspecified: Secondary | ICD-10-CM | POA: Diagnosis not present

## 2016-01-16 DIAGNOSIS — D61818 Other pancytopenia: Secondary | ICD-10-CM | POA: Diagnosis not present

## 2016-01-16 DIAGNOSIS — N289 Disorder of kidney and ureter, unspecified: Secondary | ICD-10-CM | POA: Diagnosis not present

## 2016-01-16 DIAGNOSIS — B349 Viral infection, unspecified: Secondary | ICD-10-CM | POA: Diagnosis not present

## 2016-01-16 DIAGNOSIS — R05 Cough: Secondary | ICD-10-CM | POA: Diagnosis not present

## 2016-01-26 DIAGNOSIS — N289 Disorder of kidney and ureter, unspecified: Secondary | ICD-10-CM | POA: Diagnosis not present

## 2016-01-26 DIAGNOSIS — R05 Cough: Secondary | ICD-10-CM | POA: Diagnosis not present

## 2016-01-26 DIAGNOSIS — D61818 Other pancytopenia: Secondary | ICD-10-CM | POA: Diagnosis not present

## 2016-01-26 DIAGNOSIS — B349 Viral infection, unspecified: Secondary | ICD-10-CM | POA: Diagnosis not present

## 2016-02-13 ENCOUNTER — Other Ambulatory Visit: Payer: Self-pay | Admitting: Family Medicine

## 2016-03-20 ENCOUNTER — Other Ambulatory Visit: Payer: Self-pay | Admitting: Family Medicine

## 2016-07-27 DIAGNOSIS — N289 Disorder of kidney and ureter, unspecified: Secondary | ICD-10-CM | POA: Diagnosis not present

## 2016-07-27 DIAGNOSIS — H04123 Dry eye syndrome of bilateral lacrimal glands: Secondary | ICD-10-CM | POA: Diagnosis not present

## 2016-07-27 DIAGNOSIS — E039 Hypothyroidism, unspecified: Secondary | ICD-10-CM | POA: Diagnosis not present

## 2016-07-27 DIAGNOSIS — Z23 Encounter for immunization: Secondary | ICD-10-CM | POA: Diagnosis not present

## 2016-07-27 DIAGNOSIS — Z961 Presence of intraocular lens: Secondary | ICD-10-CM | POA: Diagnosis not present

## 2017-01-12 DIAGNOSIS — M532X7 Spinal instabilities, lumbosacral region: Secondary | ICD-10-CM | POA: Diagnosis not present

## 2017-02-05 DIAGNOSIS — L401 Generalized pustular psoriasis: Secondary | ICD-10-CM | POA: Diagnosis not present

## 2017-02-11 DIAGNOSIS — N289 Disorder of kidney and ureter, unspecified: Secondary | ICD-10-CM | POA: Diagnosis not present

## 2017-02-11 DIAGNOSIS — D61818 Other pancytopenia: Secondary | ICD-10-CM | POA: Diagnosis not present

## 2017-02-11 DIAGNOSIS — E039 Hypothyroidism, unspecified: Secondary | ICD-10-CM | POA: Diagnosis not present

## 2017-02-11 DIAGNOSIS — Z0000 Encounter for general adult medical examination without abnormal findings: Secondary | ICD-10-CM | POA: Diagnosis not present

## 2017-02-11 DIAGNOSIS — Z79899 Other long term (current) drug therapy: Secondary | ICD-10-CM | POA: Diagnosis not present

## 2017-02-16 DIAGNOSIS — M545 Low back pain: Secondary | ICD-10-CM | POA: Diagnosis not present

## 2017-02-16 DIAGNOSIS — M5136 Other intervertebral disc degeneration, lumbar region: Secondary | ICD-10-CM | POA: Diagnosis not present

## 2017-02-19 DIAGNOSIS — M5136 Other intervertebral disc degeneration, lumbar region: Secondary | ICD-10-CM | POA: Diagnosis not present

## 2017-04-11 DIAGNOSIS — M47817 Spondylosis without myelopathy or radiculopathy, lumbosacral region: Secondary | ICD-10-CM | POA: Diagnosis not present

## 2017-04-11 DIAGNOSIS — M545 Low back pain: Secondary | ICD-10-CM | POA: Diagnosis not present

## 2017-05-26 DIAGNOSIS — M545 Low back pain: Secondary | ICD-10-CM | POA: Diagnosis not present

## 2017-05-28 ENCOUNTER — Encounter (HOSPITAL_COMMUNITY): Payer: Self-pay | Admitting: Emergency Medicine

## 2017-05-28 ENCOUNTER — Emergency Department (HOSPITAL_COMMUNITY)
Admission: EM | Admit: 2017-05-28 | Discharge: 2017-05-29 | Disposition: A | Discharge status: Home / Self Care | Payer: Medicare Other | Attending: Emergency Medicine | Admitting: Emergency Medicine

## 2017-05-28 DIAGNOSIS — E039 Hypothyroidism, unspecified: Secondary | ICD-10-CM | POA: Insufficient documentation

## 2017-05-28 DIAGNOSIS — R4182 Altered mental status, unspecified: Secondary | ICD-10-CM | POA: Insufficient documentation

## 2017-05-28 DIAGNOSIS — N189 Chronic kidney disease, unspecified: Secondary | ICD-10-CM | POA: Diagnosis not present

## 2017-05-28 DIAGNOSIS — Z79899 Other long term (current) drug therapy: Secondary | ICD-10-CM | POA: Insufficient documentation

## 2017-05-28 DIAGNOSIS — R410 Disorientation, unspecified: Secondary | ICD-10-CM | POA: Diagnosis not present

## 2017-05-28 DIAGNOSIS — R404 Transient alteration of awareness: Secondary | ICD-10-CM | POA: Diagnosis not present

## 2017-05-28 DIAGNOSIS — R42 Dizziness and giddiness: Secondary | ICD-10-CM | POA: Diagnosis not present

## 2017-05-28 DIAGNOSIS — N289 Disorder of kidney and ureter, unspecified: Secondary | ICD-10-CM

## 2017-05-28 DIAGNOSIS — F0390 Unspecified dementia without behavioral disturbance: Secondary | ICD-10-CM | POA: Diagnosis not present

## 2017-05-28 LAB — URINALYSIS, ROUTINE W REFLEX MICROSCOPIC
Bacteria, UA: NONE SEEN
Bilirubin Urine: NEGATIVE
GLUCOSE, UA: NEGATIVE mg/dL
Ketones, ur: NEGATIVE mg/dL
Leukocytes, UA: NEGATIVE
Nitrite: NEGATIVE
PH: 5 (ref 5.0–8.0)
Protein, ur: NEGATIVE mg/dL
RBC / HPF: NONE SEEN RBC/hpf (ref 0–5)
SPECIFIC GRAVITY, URINE: 1.001 — AB (ref 1.005–1.030)
Squamous Epithelial / LPF: NONE SEEN
WBC, UA: NONE SEEN WBC/hpf (ref 0–5)

## 2017-05-28 LAB — CBG MONITORING, ED: GLUCOSE-CAPILLARY: 86 mg/dL (ref 65–99)

## 2017-05-28 NOTE — ED Triage Notes (Signed)
Family reports brief episode of confusion at 1900 today, lsw 1715. Confusion to date and time, factual information like president of [place]. Alert to self and place. Some repeative speech like "I don't know if my clothes are clean." Unsure why she's here. From home alone, daughter will be here in three hours.

## 2017-05-29 ENCOUNTER — Emergency Department (HOSPITAL_COMMUNITY): Payer: Medicare Other

## 2017-05-29 DIAGNOSIS — R410 Disorientation, unspecified: Secondary | ICD-10-CM | POA: Diagnosis not present

## 2017-05-29 LAB — CBC
HEMATOCRIT: 37.2 % (ref 36.0–46.0)
Hemoglobin: 12.2 g/dL (ref 12.0–15.0)
MCH: 29.3 pg (ref 26.0–34.0)
MCHC: 32.8 g/dL (ref 30.0–36.0)
MCV: 89.4 fL (ref 78.0–100.0)
PLATELETS: 188 10*3/uL (ref 150–400)
RBC: 4.16 MIL/uL (ref 3.87–5.11)
RDW: 14.9 % (ref 11.5–15.5)
WBC: 6.5 10*3/uL (ref 4.0–10.5)

## 2017-05-29 LAB — BASIC METABOLIC PANEL
Anion gap: 9 (ref 5–15)
BUN: 16 mg/dL (ref 6–20)
CHLORIDE: 104 mmol/L (ref 101–111)
CO2: 23 mmol/L (ref 22–32)
CREATININE: 1.5 mg/dL — AB (ref 0.44–1.00)
Calcium: 8.9 mg/dL (ref 8.9–10.3)
GFR calc Af Amer: 38 mL/min — ABNORMAL LOW (ref >60)
GFR calc non Af Amer: 33 mL/min — ABNORMAL LOW (ref >60)
GLUCOSE: 87 mg/dL (ref 65–99)
POTASSIUM: 4.1 mmol/L (ref 3.5–5.1)
Sodium: 136 mmol/L (ref 135–145)

## 2017-05-29 LAB — DIFFERENTIAL
BASOS ABS: 0 10*3/uL (ref 0.0–0.1)
BASOS PCT: 1 %
Eosinophils Absolute: 0 10*3/uL (ref 0.0–0.7)
Eosinophils Relative: 1 %
LYMPHS ABS: 1.5 10*3/uL (ref 0.7–4.0)
Lymphocytes Relative: 24 %
MONOS PCT: 10 %
Monocytes Absolute: 0.6 10*3/uL (ref 0.1–1.0)
NEUTROS ABS: 4.1 10*3/uL (ref 1.7–7.7)
Neutrophils Relative %: 64 %

## 2017-05-29 LAB — TROPONIN I: Troponin I: <0.03 ng/mL (ref <0.03)

## 2017-05-29 MED ORDER — SODIUM CHLORIDE 0.9 % IV BOLUS (SEPSIS)
1000.0000 mL | Freq: Once | INTRAVENOUS | Status: AC
  Administered 2017-05-29: 1000 mL via INTRAVENOUS

## 2017-05-29 NOTE — ED Notes (Signed)
Taken to xray at this time. 

## 2017-05-29 NOTE — ED Provider Notes (Addendum)
[facility]-EMERGENCY DEPT Provider Note   CSN: [id number] Arrival date & time: 05/28/17  2321     History   Chief Complaint Chief Complaint  Patient presents with  . Dizziness    HPI Monica Edwards is a 76 y.o. female.  The history is provided by a relative. The history is limited by the condition of the patient (Dementia).  She has a history of significant dementia. She was in her normal state of health this afternoon at 5 PM. At 7 PM, she called her niece as was very agitated and confused. She has gradually returned to baseline. She is here with her brother and sister-in-law who feels she is just about back to normal. Family suspects that she might not have been drinking enough. Patient has no insight as to why she is here, but she has no complaints.  Past Medical History:  Diagnosis Date  . Allergy   . Dementia   . Depression   . Osteoporosis 2006  . Thyroid disease     Patient Active Problem List   Diagnosis Date Noted  . Back pain 12/16/2014  . 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 Diarrhea 08/28/2014  . Other malaise and fatigue 05/15/2014  . 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 Memory loss 05/14/2013  . Ear pain 01/08/2013  . 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 Hyperlipidemia 02/05/2011  . CAROTID BRUIT 12/03/2009  . HEMATURIA, HX OF 11/12/2009  . GERD 08/13/2009  . MITRAL VALVE PROLAPSE, HX OF 04/17/2008  . HYPOTHYROIDISM 01/18/2007  . Depression 01/18/2007  . VENOUS INSUFFICIENCY 01/18/2007  . ALLERGIC RHINITIS 01/18/2007  . LOW BACK PAIN, CHRONIC 01/18/2007  . OSTEOPOROSIS 01/18/2007    Past Surgical History:  Procedure Laterality Date  . brain surgery angionoma  1988  . CHOLECYSTECTOMY  06/15/2004  . spondylosis  08/26/2003   L-S    OB History    No data available       Home Medications    Prior to Admission medications   Medication Sig Start Date End Date Taking? Authorizing Provider  Biotin 5000 MCG CAPS Take 1 capsule by  mouth daily.      [provider]  Calcium Carbonate-Vit D-Min 600-400 MG-UNIT TABS Take 1 tablet by mouth daily.      [provider]  cholecalciferol (VITAMIN D) 1000 UNITS tablet Take 1,000 Units by mouth 3 (three) times daily.    [provider]  cyanocobalamin (,VITAMIN B-12,) 1000 MCG/ML injection Inject 1,000 mcg into the muscle every 30 (thirty) days.    [provider]  escitalopram (LEXAPRO) 20 MG tablet TAKE 1 TABLET BY MOUTH DAILY 07/15/15   Dianne Dun, MD  fish oil-omega-3 fatty acids 1000 MG capsule Take 1 g by mouth 3 (three) times daily.      [provider]  levothyroxine (SYNTHROID, LEVOTHROID) 100 MCG tablet Take 1 tablet (100 mcg total) by mouth daily. 01/23/15   Dianne Dun, MD  meclizine (ANTIVERT) 25 MG tablet Take 25 mg by mouth 3 (three) times daily as needed.    [provider]  Multiple Vitamin (MULTIVITAMIN PO) Take 1 tablet by mouth daily.      [provider]  ranitidine (ZANTAC) 150 MG tablet TAKE 1 TABLET BY MOUTH TWICE A DAY 02/16/16   Dianne Dun, MD  vitamin B-12 (CYANOCOBALAMIN) 1000 MCG tablet Take 1,000 mcg by mouth daily.    [provider]  vitamin E 400 UNIT capsule Take 400 Units  by mouth 2 (two) times daily.    [provider]    Family History History reviewed. No pertinent family history.  Social History Social History  Substance Use Topics  . Smoking status: Never Smoker  . Smokeless tobacco: Never Used  . Alcohol use No     Allergies   Alendronate sodium; Ezetimibe-simvastatin; Pravastatin sodium; Relafen [nabumetone]; and Risedronate sodium   Review of Systems Review of Systems  Unable to perform ROS: Dementia     Physical Exam Updated Vital Signs BP (!) 169/65 (BP Location: Right Arm)   Pulse (!) 51   Temp 98.3 F (36.8 C) (Oral)   Resp 18   SpO2 99%   Physical Exam  Nursing note and vitals reviewed.  76 year old female, resting  comfortably and in no acute distress. Vital signs are significant for hypertension and bradycardia. Oxygen saturation is 99%, which is normal. Head is normocephalic and atraumatic. PERRLA, EOMI. Oropharynx is clear. Neck is nontender and supple without adenopathy or JVD. Back is nontender and there is no CVA tenderness. Lungs are clear without rales, wheezes, or rhonchi. Chest is nontender. Heart has regular rate and rhythm without murmur. Abdomen is soft, flat, nontender without masses or hepatosplenomegaly and peristalsis is normoactive. Extremities have no cyanosis or edema, full range of motion is present. Skin is warm and dry without rash. Neurologic: She is awake and alert, oriented to person and place but only partly oriented to time (knows the year, but not month or day or date), cranial nerves are intact, there are no motor or sensory deficits.  ED Treatments / Results  Labs (all labs ordered are listed, but only abnormal results are displayed) Labs Reviewed  BASIC METABOLIC PANEL - Abnormal; Notable for the following:       Result Value   Creatinine, Ser 1.50 (*)    GFR calc non Af Amer 33 (*)    GFR calc Af Amer 38 (*)    All other components within normal limits  URINALYSIS, ROUTINE W REFLEX MICROSCOPIC - Abnormal; Notable for the following:    Color, Urine COLORLESS (*)    Specific Gravity, Urine 1.001 (*)    Hgb urine dipstick SMALL (*)    All other components within normal limits  CBC  DIFFERENTIAL  TROPONIN I  CBG MONITORING, ED  CBG MONITORING, ED    EKG  EKG Interpretation  Date/Time:  Saturday May 28 2017 23:22:58 EDT Ventricular Rate:  52 PR Interval:    QRS Duration: 90 QT Interval:  477 QTC Calculation: 444 R Axis:   49 Text Interpretation:  Sinus rhythm Low voltage, precordial leads When compared with ECG of 06/11/2004, No significant change was found Confirmed by Dione BoozeGlick, Adriyana Greenbaum ([id number]) on 05/28/2017 11:39:14 PM       Radiology Dg Chest 2  View  Result Date: 05/29/2017 CLINICAL DATA:  Acute onset of confusion.  Initial encounter. EXAM: CHEST  2 VIEW COMPARISON:  Chest radiograph performed 08/11/2011, and CT of the chest performed 08/20/2011 FINDINGS: The lungs are well-aerated and clear. There is no evidence of focal opacification, pleural effusion or pneumothorax. The heart is borderline normal in size. No acute osseous abnormalities are seen. IMPRESSION: No acute cardiopulmonary process seen. Electronically Signed   By: Roanna RaiderJeffery  Chang M.D.   On: 05/29/2017 01:08   Ct Head Wo Contrast  Result Date: 05/29/2017 CLINICAL DATA:  Acute onset of confusion.  Initial encounter. EXAM: CT HEAD WITHOUT CONTRAST TECHNIQUE: Contiguous axial images were obtained from the  base of the skull through the vertex without intravenous contrast. COMPARISON:  CT of the head performed 12/22/2015 FINDINGS: Brain: No evidence of acute infarction, hemorrhage, hydrocephalus, extra-axial collection or mass lesion/mass effect. Prominence of the ventricles and sulci reflects mild cortical volume loss. Cerebellar atrophy is noted. Postoperative change is again noted at the left cerebellum. Mild periventricular white matter change likely reflects small vessel ischemic microangiopathy. The brainstem and fourth ventricle are within normal limits. The basal ganglia are unremarkable in appearance. The cerebral hemispheres demonstrate grossly normal gray-white differentiation. No mass effect or midline shift is seen. Vascular: No hyperdense vessel or unexpected calcification. Skull: There is no evidence of fracture; visualized osseous structures are unremarkable in appearance. Sinuses/Orbits: The orbits are within normal limits. The paranasal sinuses and mastoid air cells are well-aerated. Other: No significant soft tissue abnormalities are seen. IMPRESSION: 1. No acute intracranial pathology seen on CT. 2. Mild cortical volume loss and scattered small vessel ischemic  microangiopathy. Electronically Signed   By: Roanna Raider M.D.   On: 05/29/2017 00:32    Procedures Procedures (including critical care time)  Medications Ordered in ED Medications - No data to display   Initial Impression / Assessment and Plan / ED Course  I have reviewed the triage vital signs and the nursing notes.  Pertinent labs & imaging results that were available during my care of the patient were reviewed by me and considered in my medical decision making (see chart for details).  Altered mental status which is improving. Possible dehydration. Old records are reviewed, and she has a similar ED visit in March 2017 which was felt to be due to dehydration. Laboratory workup today shows mild elevation of creatinine which is unchanged from previous ED visit. Urinalysis shows no evidence of infection. She is being sent for CT of head and chest x-ray, and she will be given some IV fluid.  Patients daughter has arrived and states that she is back to her baseline. No acute process seen on CT of head, or on chest x-ray. She is felt to be safe for discharge at this point. I have discussed possible need for someone to stay with patient given her progressive dementia.  Final Clinical Impressions(s) / ED Diagnoses   Final diagnoses:  Altered mental status, unspecified altered mental status type  Renal insufficiency    New Prescriptions New Prescriptions   No medications on file     Dione Booze, MD 05/29/17 Harlon Ditty, MD 05/29/17 0225

## 2017-05-29 NOTE — ED Notes (Signed)
Pt left at this time with all belongings.  

## 2017-05-29 NOTE — Discharge Instructions (Signed)
Make sure your drink enough fluids.

## 2017-08-12 DIAGNOSIS — R350 Frequency of micturition: Secondary | ICD-10-CM | POA: Diagnosis not present

## 2017-08-12 DIAGNOSIS — M545 Low back pain: Secondary | ICD-10-CM | POA: Diagnosis not present

## 2017-08-23 IMAGING — CT CT HEAD W/O CM
4 series · 15 of 47 positions shown, 17 images · non-contrast
Comparison: CT of the head performed 12/22/2015

CLINICAL DATA: Acute onset of confusion.  Initial encounter.

EXAM:
CT HEAD WITHOUT CONTRAST
TECHNIQUE: Contiguous axial images were obtained from the base of the skull
through the vertex without intravenous contrast.

[Series 3: head wo · axial · 0.44mm/px · z∈[-132,-17]mm · 7 of 31 slices shown, 9 images]
[im 4/31  brain]
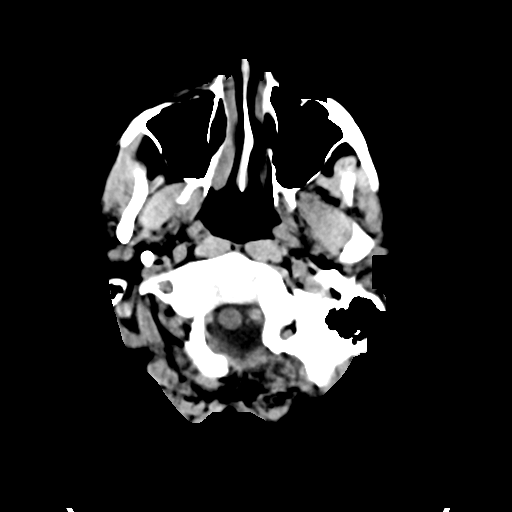
[im 4/31  bone]
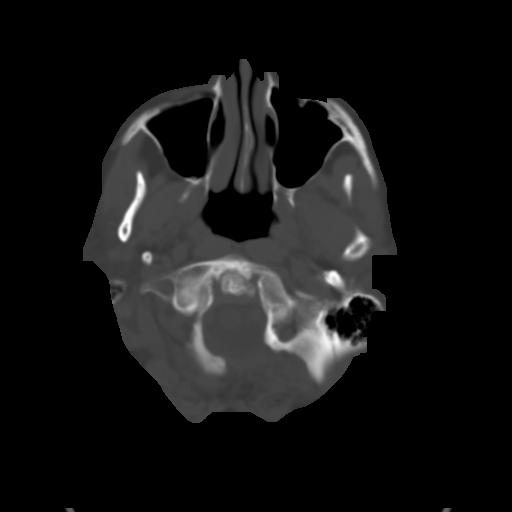
[im 8/31  brain]
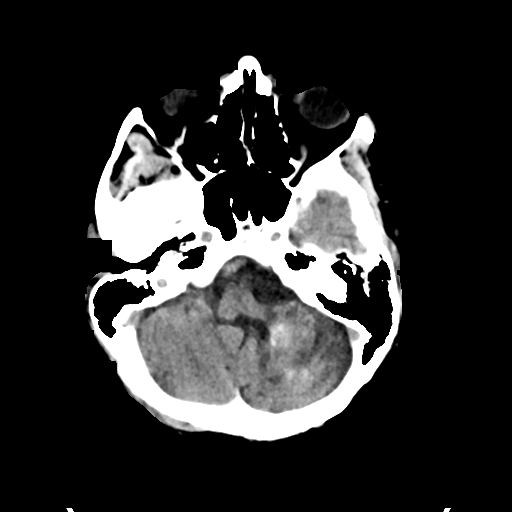
[im 12/31  brain]
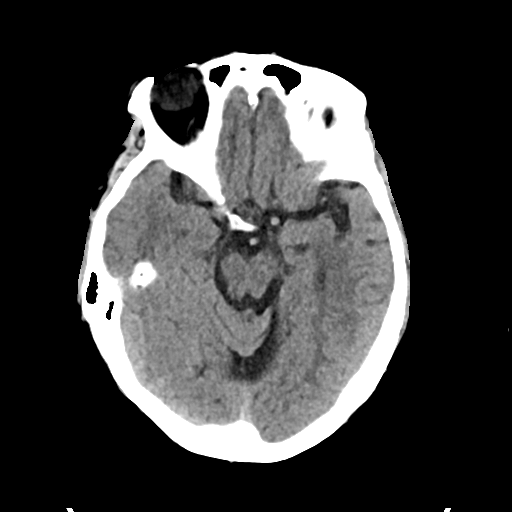
[im 16/31  brain]
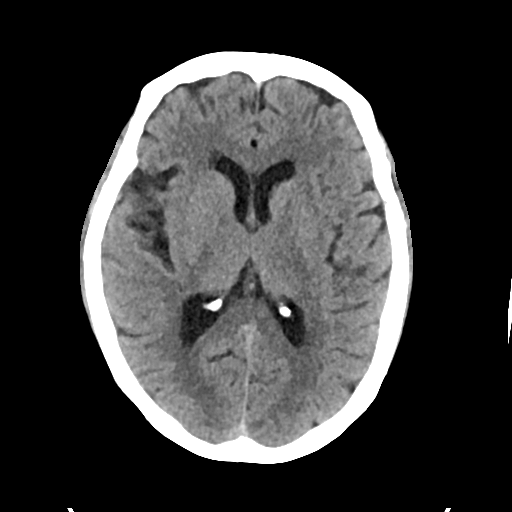
[im 19/31  brain]
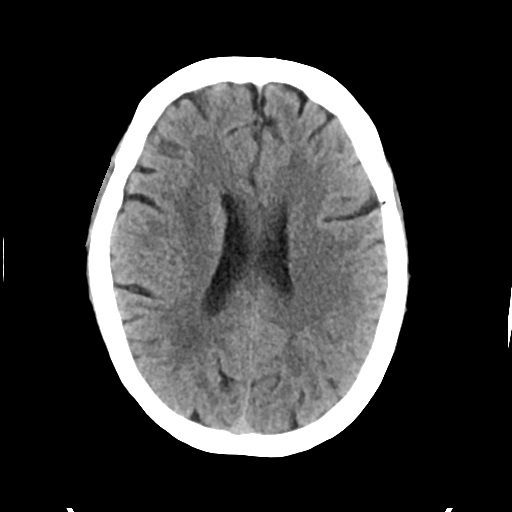
[im 19/31  bone]
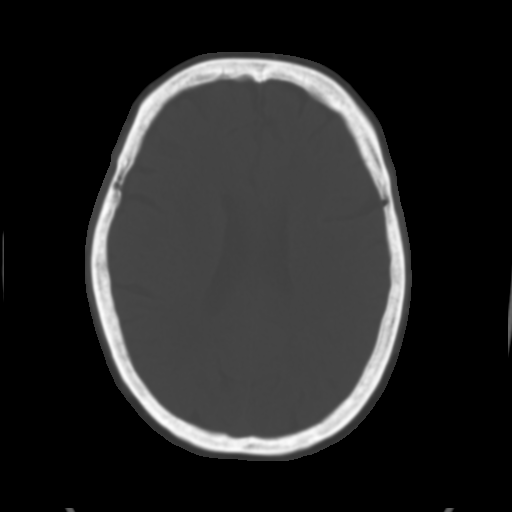
[im 23/31  brain]
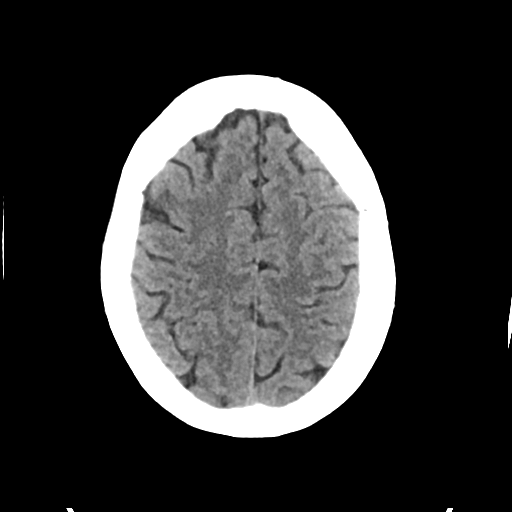
[im 27/31  brain]
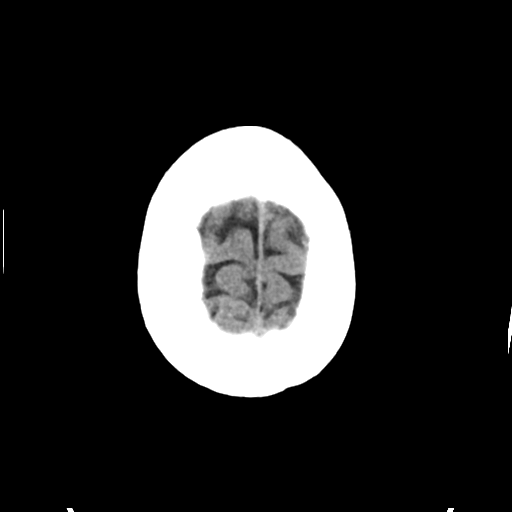

[Series 4: head bone · axial · 0.44mm/px · z∈[-133,-117]mm · 2 of 77 slices shown]
[im 8/77  bone]
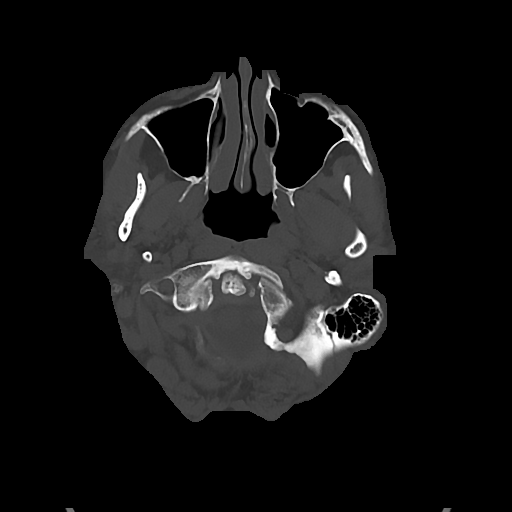
[im 16/77  bone]
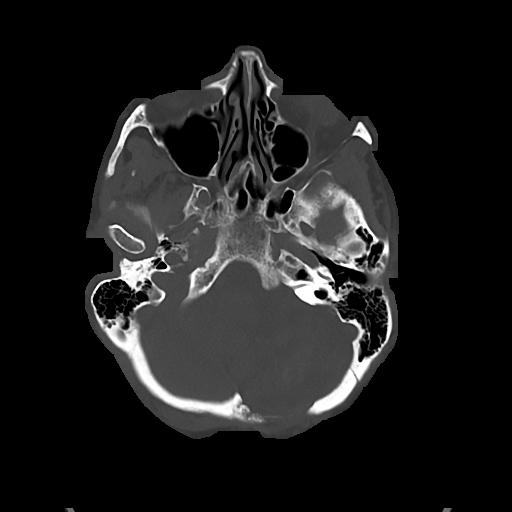

[Series 5: cor soft · coronal · 0.30mm/px · 3 of 67 slices shown]
[im 23/67  brain]
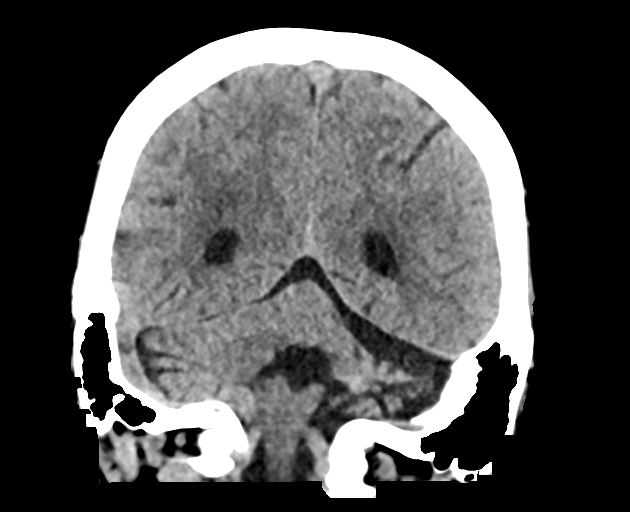
[im 30/67  brain]
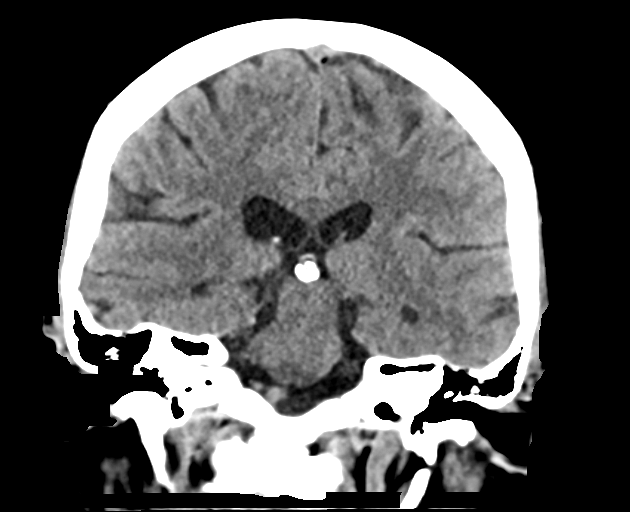
[im 37/67  brain]
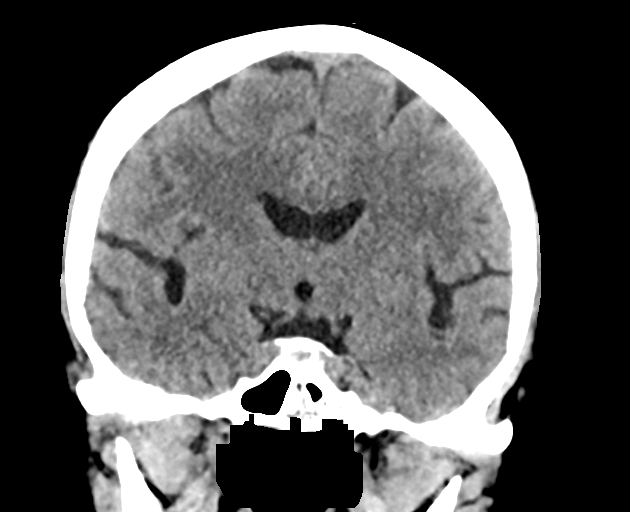

[Series 6: sag soft · sagittal · 0.30mm/px · 3 of 58 slices shown]
[im 20/58  brain]
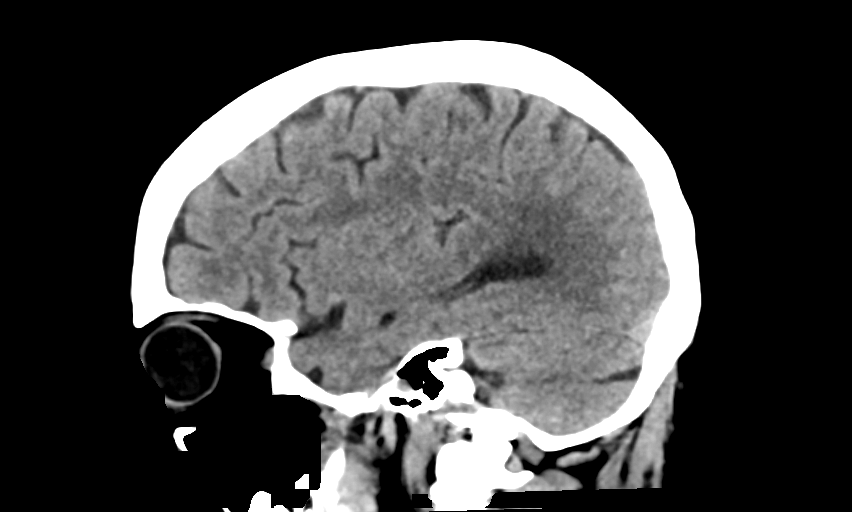
[im 29/58  brain]
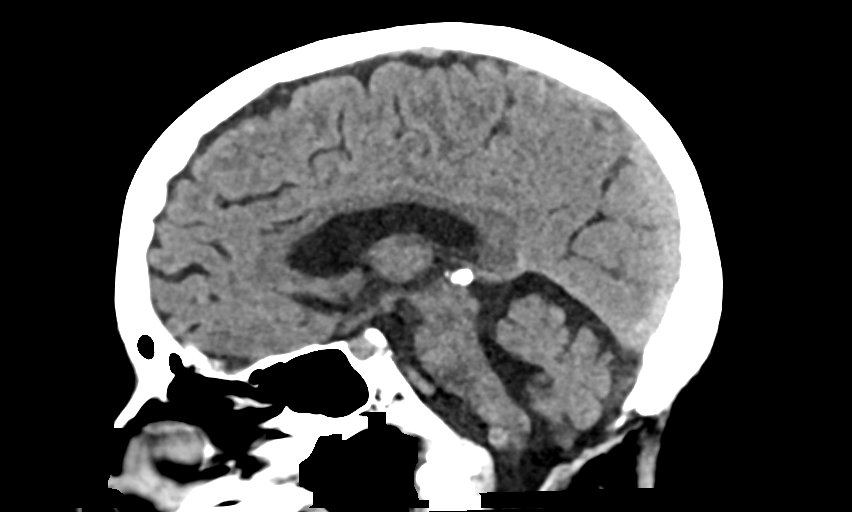
[im 39/58  brain]
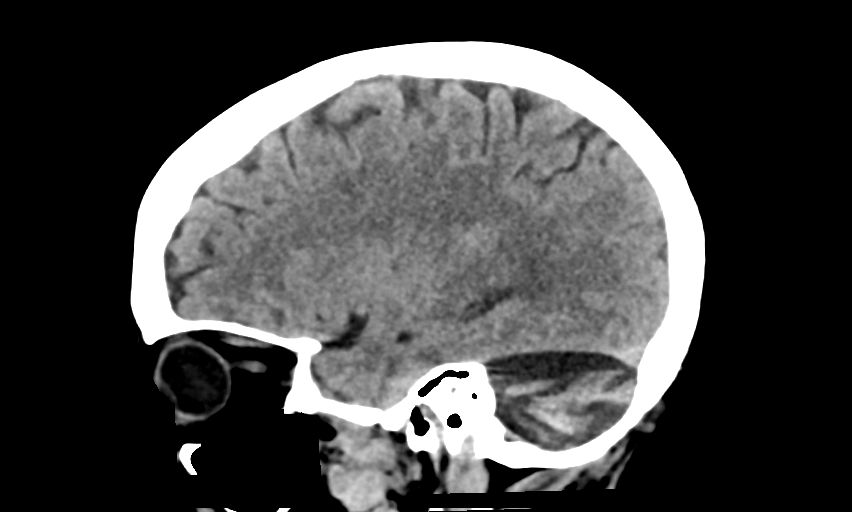

[15 of 47 positions shown; findings below may reference images not displayed]

FINDINGS: Brain: No evidence of acute infarction, hemorrhage, hydrocephalus,
extra-axial collection or mass lesion/mass effect.

Prominence of the ventricles and sulci reflects mild cortical volume
loss. Cerebellar atrophy is noted. Postoperative change is again
noted at the left cerebellum. Mild periventricular white matter
change likely reflects small vessel ischemic microangiopathy.

The brainstem and fourth ventricle are within normal limits. The
basal ganglia are unremarkable in appearance. The cerebral
hemispheres demonstrate grossly normal gray-white differentiation.
No mass effect or midline shift is seen.

Vascular: No hyperdense vessel or unexpected calcification.

Skull: There is no evidence of fracture; visualized osseous
structures are unremarkable in appearance.

Sinuses/Orbits: The orbits are within normal limits. The paranasal
sinuses and mastoid air cells are well-aerated.

Other: No significant soft tissue abnormalities are seen.
IMPRESSION: 1. No acute intracranial pathology seen on CT.
2. Mild cortical volume loss and scattered small vessel ischemic
microangiopathy.

## 2017-10-19 DIAGNOSIS — R8290 Unspecified abnormal findings in urine: Secondary | ICD-10-CM | POA: Diagnosis not present

## 2017-10-19 DIAGNOSIS — M545 Low back pain: Secondary | ICD-10-CM | POA: Diagnosis not present

## 2018-03-02 DIAGNOSIS — R451 Restlessness and agitation: Secondary | ICD-10-CM | POA: Diagnosis not present

## 2018-04-14 DIAGNOSIS — S80862A Insect bite (nonvenomous), left lower leg, initial encounter: Secondary | ICD-10-CM | POA: Diagnosis not present

## 2018-04-14 DIAGNOSIS — R21 Rash and other nonspecific skin eruption: Secondary | ICD-10-CM | POA: Diagnosis not present

## 2018-05-04 DIAGNOSIS — R451 Restlessness and agitation: Secondary | ICD-10-CM | POA: Diagnosis not present

## 2018-05-04 DIAGNOSIS — M79672 Pain in left foot: Secondary | ICD-10-CM | POA: Diagnosis not present

## 2018-05-04 DIAGNOSIS — R6889 Other general symptoms and signs: Secondary | ICD-10-CM | POA: Diagnosis not present

## 2018-05-04 DIAGNOSIS — M545 Low back pain: Secondary | ICD-10-CM | POA: Diagnosis not present

## 2018-05-04 DIAGNOSIS — Z0001 Encounter for general adult medical examination with abnormal findings: Secondary | ICD-10-CM | POA: Diagnosis not present

## 2018-05-04 DIAGNOSIS — E559 Vitamin D deficiency, unspecified: Secondary | ICD-10-CM | POA: Diagnosis not present

## 2018-05-04 DIAGNOSIS — E538 Deficiency of other specified B group vitamins: Secondary | ICD-10-CM | POA: Diagnosis not present

## 2018-06-21 DIAGNOSIS — M545 Low back pain: Secondary | ICD-10-CM | POA: Diagnosis not present

## 2018-06-21 DIAGNOSIS — E039 Hypothyroidism, unspecified: Secondary | ICD-10-CM | POA: Diagnosis not present

## 2018-06-21 DIAGNOSIS — M79672 Pain in left foot: Secondary | ICD-10-CM | POA: Diagnosis not present

## 2018-06-21 DIAGNOSIS — Z23 Encounter for immunization: Secondary | ICD-10-CM | POA: Diagnosis not present

## 2018-06-21 DIAGNOSIS — R451 Restlessness and agitation: Secondary | ICD-10-CM | POA: Diagnosis not present

## 2018-10-25 DIAGNOSIS — M79672 Pain in left foot: Secondary | ICD-10-CM | POA: Diagnosis not present

## 2018-10-25 DIAGNOSIS — E039 Hypothyroidism, unspecified: Secondary | ICD-10-CM | POA: Diagnosis not present

## 2018-12-14 DIAGNOSIS — H35363 Drusen (degenerative) of macula, bilateral: Secondary | ICD-10-CM | POA: Diagnosis not present

## 2018-12-14 DIAGNOSIS — H524 Presbyopia: Secondary | ICD-10-CM | POA: Diagnosis not present

## 2018-12-16 DIAGNOSIS — E039 Hypothyroidism, unspecified: Secondary | ICD-10-CM | POA: Diagnosis not present

## 2018-12-16 DIAGNOSIS — D61818 Other pancytopenia: Secondary | ICD-10-CM | POA: Diagnosis not present

## 2019-05-18 DIAGNOSIS — K602 Anal fissure, unspecified: Secondary | ICD-10-CM | POA: Diagnosis not present

## 2019-06-18 DIAGNOSIS — D61818 Other pancytopenia: Secondary | ICD-10-CM | POA: Diagnosis not present

## 2019-06-18 DIAGNOSIS — E039 Hypothyroidism, unspecified: Secondary | ICD-10-CM | POA: Diagnosis not present

## 2019-07-06 DIAGNOSIS — E039 Hypothyroidism, unspecified: Secondary | ICD-10-CM | POA: Diagnosis not present

## 2019-07-06 DIAGNOSIS — D61818 Other pancytopenia: Secondary | ICD-10-CM | POA: Diagnosis not present

## 2019-08-02 DIAGNOSIS — E039 Hypothyroidism, unspecified: Secondary | ICD-10-CM | POA: Diagnosis not present

## 2019-08-02 DIAGNOSIS — E538 Deficiency of other specified B group vitamins: Secondary | ICD-10-CM | POA: Diagnosis not present

## 2019-08-02 DIAGNOSIS — M79671 Pain in right foot: Secondary | ICD-10-CM | POA: Diagnosis not present

## 2019-08-02 DIAGNOSIS — D61818 Other pancytopenia: Secondary | ICD-10-CM | POA: Diagnosis not present

## 2019-08-02 DIAGNOSIS — Z23 Encounter for immunization: Secondary | ICD-10-CM | POA: Diagnosis not present

## 2019-08-02 DIAGNOSIS — Z0000 Encounter for general adult medical examination without abnormal findings: Secondary | ICD-10-CM | POA: Diagnosis not present

## 2019-08-02 DIAGNOSIS — Z1389 Encounter for screening for other disorder: Secondary | ICD-10-CM | POA: Diagnosis not present

## 2019-08-02 DIAGNOSIS — E559 Vitamin D deficiency, unspecified: Secondary | ICD-10-CM | POA: Diagnosis not present

## 2019-12-29 DIAGNOSIS — M7051 Other bursitis of knee, right knee: Secondary | ICD-10-CM | POA: Diagnosis not present

## 2020-02-04 DIAGNOSIS — M179 Osteoarthritis of knee, unspecified: Secondary | ICD-10-CM | POA: Diagnosis not present

## 2020-02-04 DIAGNOSIS — D61818 Other pancytopenia: Secondary | ICD-10-CM | POA: Diagnosis not present

## 2020-02-04 DIAGNOSIS — M25561 Pain in right knee: Secondary | ICD-10-CM | POA: Diagnosis not present

## 2020-05-14 ENCOUNTER — Other Ambulatory Visit: Payer: Self-pay | Admitting: Internal Medicine

## 2020-05-14 ENCOUNTER — Ambulatory Visit
Admission: RE | Admit: 2020-05-14 | Discharge: 2020-05-14 | Disposition: A | Discharge status: Home / Self Care | Payer: Medicare Other | Source: Ambulatory Visit | Attending: Internal Medicine | Admitting: Internal Medicine

## 2020-05-14 DIAGNOSIS — E039 Hypothyroidism, unspecified: Secondary | ICD-10-CM | POA: Diagnosis not present

## 2020-05-14 DIAGNOSIS — E538 Deficiency of other specified B group vitamins: Secondary | ICD-10-CM | POA: Diagnosis not present

## 2020-05-14 DIAGNOSIS — R079 Chest pain, unspecified: Secondary | ICD-10-CM | POA: Diagnosis not present

## 2020-05-14 DIAGNOSIS — M25561 Pain in right knee: Secondary | ICD-10-CM | POA: Diagnosis not present

## 2020-06-16 DIAGNOSIS — E039 Hypothyroidism, unspecified: Secondary | ICD-10-CM | POA: Diagnosis not present

## 2020-06-16 DIAGNOSIS — R634 Abnormal weight loss: Secondary | ICD-10-CM | POA: Diagnosis not present

## 2020-08-25 DIAGNOSIS — E876 Hypokalemia: Secondary | ICD-10-CM | POA: Diagnosis not present

## 2020-08-25 DIAGNOSIS — E539 Vitamin B deficiency, unspecified: Secondary | ICD-10-CM | POA: Diagnosis not present

## 2020-08-25 DIAGNOSIS — E559 Vitamin D deficiency, unspecified: Secondary | ICD-10-CM | POA: Diagnosis not present

## 2020-08-25 DIAGNOSIS — Z0001 Encounter for general adult medical examination with abnormal findings: Secondary | ICD-10-CM | POA: Diagnosis not present

## 2020-08-25 DIAGNOSIS — E538 Deficiency of other specified B group vitamins: Secondary | ICD-10-CM | POA: Diagnosis not present

## 2020-08-25 DIAGNOSIS — E039 Hypothyroidism, unspecified: Secondary | ICD-10-CM | POA: Diagnosis not present

## 2020-12-08 DIAGNOSIS — R634 Abnormal weight loss: Secondary | ICD-10-CM | POA: Diagnosis not present

## 2020-12-08 DIAGNOSIS — M7581 Other shoulder lesions, right shoulder: Secondary | ICD-10-CM | POA: Diagnosis not present

## 2021-05-16 DIAGNOSIS — R609 Edema, unspecified: Secondary | ICD-10-CM | POA: Diagnosis not present

## 2021-07-23 ENCOUNTER — Emergency Department (HOSPITAL_COMMUNITY): Payer: Medicare Other

## 2021-07-23 ENCOUNTER — Encounter (HOSPITAL_COMMUNITY): Payer: Self-pay | Admitting: *Deleted

## 2021-07-23 ENCOUNTER — Emergency Department (HOSPITAL_COMMUNITY)
Admission: EM | Admit: 2021-07-23 | Discharge: 2021-07-23 | Disposition: A | Discharge status: Home / Self Care | Payer: Medicare Other | Attending: Emergency Medicine | Admitting: Emergency Medicine

## 2021-07-23 ENCOUNTER — Other Ambulatory Visit: Payer: Self-pay

## 2021-07-23 DIAGNOSIS — R531 Weakness: Secondary | ICD-10-CM | POA: Diagnosis not present

## 2021-07-23 DIAGNOSIS — G9389 Other specified disorders of brain: Secondary | ICD-10-CM | POA: Diagnosis not present

## 2021-07-23 DIAGNOSIS — G319 Degenerative disease of nervous system, unspecified: Secondary | ICD-10-CM | POA: Diagnosis not present

## 2021-07-23 DIAGNOSIS — F419 Anxiety disorder, unspecified: Secondary | ICD-10-CM | POA: Insufficient documentation

## 2021-07-23 DIAGNOSIS — E039 Hypothyroidism, unspecified: Secondary | ICD-10-CM | POA: Insufficient documentation

## 2021-07-23 DIAGNOSIS — Z79899 Other long term (current) drug therapy: Secondary | ICD-10-CM | POA: Diagnosis not present

## 2021-07-23 DIAGNOSIS — F03B0 Unspecified dementia, moderate, without behavioral disturbance, psychotic disturbance, mood disturbance, and anxiety: Secondary | ICD-10-CM | POA: Insufficient documentation

## 2021-07-23 DIAGNOSIS — I6782 Cerebral ischemia: Secondary | ICD-10-CM | POA: Diagnosis not present

## 2021-07-23 DIAGNOSIS — R29818 Other symptoms and signs involving the nervous system: Secondary | ICD-10-CM | POA: Diagnosis not present

## 2021-07-23 LAB — COMPREHENSIVE METABOLIC PANEL
ALT: 18 U/L (ref 0–44)
AST: 24 U/L (ref 15–41)
Albumin: 3.7 g/dL (ref 3.5–5.0)
Alkaline Phosphatase: 42 U/L (ref 38–126)
Anion gap: 6 (ref 5–15)
BUN: 17 mg/dL (ref 8–23)
CO2: 25 mmol/L (ref 22–32)
Calcium: 9.2 mg/dL (ref 8.9–10.3)
Chloride: 113 mmol/L — ABNORMAL HIGH (ref 98–111)
Creatinine, Ser: 1.23 mg/dL — ABNORMAL HIGH (ref 0.44–1.00)
GFR, Estimated: 45 mL/min — ABNORMAL LOW (ref >60)
Glucose, Bld: 76 mg/dL (ref 70–99)
Potassium: 3.7 mmol/L (ref 3.5–5.1)
Sodium: 144 mmol/L (ref 135–145)
Total Bilirubin: 0.7 mg/dL (ref 0.3–1.2)
Total Protein: 6.7 g/dL (ref 6.5–8.1)

## 2021-07-23 LAB — URINALYSIS, ROUTINE W REFLEX MICROSCOPIC
Bilirubin Urine: NEGATIVE
Glucose, UA: NEGATIVE mg/dL
Hgb urine dipstick: NEGATIVE
Ketones, ur: NEGATIVE mg/dL
Leukocytes,Ua: NEGATIVE
Nitrite: NEGATIVE
Protein, ur: NEGATIVE mg/dL
Specific Gravity, Urine: 1.013 (ref 1.005–1.030)
pH: 5 (ref 5.0–8.0)

## 2021-07-23 LAB — CBC WITH DIFFERENTIAL/PLATELET
Abs Immature Granulocytes: 0.01 10*3/uL (ref 0.00–0.07)
Basophils Absolute: 0 10*3/uL (ref 0.0–0.1)
Basophils Relative: 0 %
Eosinophils Absolute: 0.1 10*3/uL (ref 0.0–0.5)
Eosinophils Relative: 1 %
HCT: 37.4 % (ref 36.0–46.0)
Hemoglobin: 11.9 g/dL — ABNORMAL LOW (ref 12.0–15.0)
Immature Granulocytes: 0 %
Lymphocytes Relative: 29 %
Lymphs Abs: 1.8 10*3/uL (ref 0.7–4.0)
MCH: 30.1 pg (ref 26.0–34.0)
MCHC: 31.8 g/dL (ref 30.0–36.0)
MCV: 94.7 fL (ref 80.0–100.0)
Monocytes Absolute: 0.6 10*3/uL (ref 0.1–1.0)
Monocytes Relative: 10 %
Neutro Abs: 3.8 10*3/uL (ref 1.7–7.7)
Neutrophils Relative %: 60 %
Platelets: 183 10*3/uL (ref 150–400)
RBC: 3.95 MIL/uL (ref 3.87–5.11)
RDW: 14.1 % (ref 11.5–15.5)
WBC: 6.3 10*3/uL (ref 4.0–10.5)
nRBC: 0 % (ref 0.0–0.2)

## 2021-07-23 LAB — BRAIN NATRIURETIC PEPTIDE: B Natriuretic Peptide: 70 pg/mL (ref 0.0–100.0)

## 2021-07-23 NOTE — ED Triage Notes (Signed)
Pt daughter states her caregiver was concerned about pt being more weak and leaning more on her left side. Daughter has noticed progressive weakness over last 3-4 weeks. Daughter has noticed pt shuffling walk over past 1 week.

## 2021-07-23 NOTE — ED Provider Notes (Signed)
[facility]-EMERGENCY DEPT Provider Note   CSN: [id number] Arrival date & time: 07/23/21  1050     History Chief Complaint  Patient presents with   Weakness    Monica Edwards is a 80 y.o. female.  Patient brought in by her daughter.  Patient has been having progressive weakness over the past 3 to 4 weeks.  They are concerned that she seems more weak here in the past weeks had a shuffled gait and leaning more to her left side.  However they do admit that she does seem to be little bit more alert today.  Patient known to have dementia no new medications.      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     No family history on file.  Social History   Tobacco Use   Smoking status: Never   Smokeless tobacco: Never  Substance Use Topics   Alcohol use: No   Drug use: No    Home Medications Prior to Admission medications   Medication Sig Start Date End Date Taking? Authorizing Provider  Biotin 5000 MCG CAPS Take 1 capsule by mouth daily.       [provider]  Calcium Carbonate-Vit D-Min 600-400 MG-UNIT TABS Take 1 tablet by mouth daily.      [provider]  cholecalciferol (VITAMIN D) 1000 UNITS tablet Take 1,000 Units by mouth 3 (three) times daily.    [provider]  cyanocobalamin (,VITAMIN B-12,) 1000 MCG/ML injection Inject 1,000 mcg into the muscle every 30 (thirty) days.    [provider]  escitalopram (LEXAPRO) 20 MG tablet TAKE 1 TABLET BY MOUTH DAILY 07/15/15   Dianne Dun, MD  fish oil-omega-3 fatty acids 1000 MG capsule Take 1 g by mouth 3 (three) times daily.      [provider]  levothyroxine (SYNTHROID, LEVOTHROID) 100 MCG tablet Take 1 tablet (100 mcg total) by mouth daily. 01/23/15   Dianne Dun, MD  meclizine (ANTIVERT) 25 MG tablet Take 25 mg by mouth 3 (three) times daily as needed.    [provider]  Multiple Vitamin (MULTIVITAMIN PO) Take 1 tablet by mouth daily.      [provider]  ranitidine (ZANTAC) 150 MG tablet TAKE 1 TABLET BY MOUTH TWICE A DAY 02/16/16   Dianne Dun, MD  vitamin B-12 (CYANOCOBALAMIN) 1000 MCG tablet Take 1,000 mcg by mouth daily.    [provider]  vitamin E 400 UNIT capsule Take 400 Units by mouth 2 (two) times daily.    [provider]    Allergies    Alendronate sodium, Ezetimibe-simvastatin, Pravastatin sodium, Relafen [nabumetone], and Risedronate sodium  Review of Systems   Review of Systems  Unable to perform ROS: Dementia   Physical Exam Updated Vital Signs BP 118/72 (BP Location: Right Arm)   Pulse (!) 51   Temp 97.7 F (36.5 C) (Oral)   Resp 17   Ht 1.562 m (5' 1.5")   Wt 61.7 kg   SpO2 100%   BMI 25.28 kg/m   Physical Exam Vitals and nursing note reviewed.  Constitutional:      General: She is not in acute distress.    Appearance: Normal appearance. She is well-developed.  HENT:     Head: Normocephalic and atraumatic.  Eyes:     Extraocular Movements: Extraocular  movements intact.     Conjunctiva/sclera: Conjunctivae normal.     Pupils: Pupils are equal, round, and reactive to light.  Cardiovascular:     Rate and Rhythm: Normal rate and regular rhythm.     Heart sounds: No murmur heard. Pulmonary:     Effort: Pulmonary effort is normal. No respiratory distress.     Breath sounds: Normal breath sounds.  Abdominal:     Palpations: Abdomen is soft.     Tenderness: There is no abdominal tenderness.  Musculoskeletal:        General: Normal range of motion.     Cervical back: Neck supple.  Skin:    General: Skin is warm and dry.  Neurological:     Mental Status: She is alert. Mental status is at baseline.     Cranial Nerves: No cranial nerve deficit.     Sensory: No sensory deficit.     Motor: No weakness.    ED Results / Procedures / Treatments   Labs (all labs ordered are listed, but only abnormal results are displayed) Labs Reviewed  COMPREHENSIVE METABOLIC PANEL - Abnormal; Notable for the following components:      Result Value   Chloride 113 (*)    Creatinine, Ser 1.23 (*)    GFR, Estimated 45 (*)    All other components within normal limits  CBC WITH DIFFERENTIAL/PLATELET - Abnormal; Notable for the following components:   Hemoglobin 11.9 (*)    All other components within normal limits  URINALYSIS, ROUTINE W REFLEX MICROSCOPIC - Abnormal; Notable for the following components:   APPearance HAZY (*)    All other components within normal limits  BRAIN NATRIURETIC PEPTIDE    EKG EKG Interpretation  Date/Time:  Thursday July 23 2021 11:57:21 EDT Ventricular Rate:  60 PR Interval:  192 QRS Duration: 78 QT Interval:  445 QTC Calculation: 445 R Axis:   79 Text Interpretation: Sinus rhythm No significant change since prior 8/18 Confirmed by Meridee Score [phone]) on 07/23/2021 12:04:07 PM  Radiology CT Head Wo Contrast  Result Date: 07/23/2021 CLINICAL DATA:  Neuro deficit, acute, stroke suspected. EXAM: CT HEAD WITHOUT  CONTRAST TECHNIQUE: Contiguous axial images were obtained from the base of the skull through the vertex without intravenous contrast. COMPARISON:  Prior head CT examinations 05/29/2017 and earlier. Report from brain MRI 07/12/2000 (images unavailable). FINDINGS: Brain: Mild generalized cerebral and cerebellar atrophy. Redemonstrated sequela of prior left suboccipital craniectomy. 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 Unchanged encephalomalacia and calcification within the left cerebellar hemisphere. Mild patchy and  ill-defined hypoattenuation within the cerebral white matter, nonspecific but compatible with chronic small vessel ischemic disease. There is no acute intracranial hemorrhage. No demarcated cortical infarct. No extra-axial fluid collection. No evidence of an intracranial mass. No midline shift. Vascular: No hyperdense vessel. Skull: Left suboccipital craniectomy.  No calvarial fracture. Sinuses/Orbits: Visualized orbits show no acute finding. No significant paranasal sinus disease at the imaged levels. IMPRESSION: No evidence of acute intracranial abnormality. 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 Unchanged encephalomalacia and calcifications within the left cerebellar hemisphere. Mild chronic small vessel ischemic changes within the cerebral white matter, progressed. Mild generalized parenchymal atrophy. Electronically Signed   By: Jackey Loge D.O.   On: 07/23/2021 12:38   MR Brain Wo Contrast (neuro protocol)  Result Date: 07/23/2021 CLINICAL DATA:  Initial evaluation for neuro deficit, stroke suspected. EXAM: MRI HEAD WITHOUT CONTRAST TECHNIQUE: Multiplanar, multiecho pulse sequences of the brain and surrounding structures were obtained without intravenous contrast. COMPARISON:  Prior CT from earlier the same day. FINDINGS: Brain:  Generalized age-related cerebral atrophy. Patchy and confluent T2/FLAIR hyperintensity involving the periventricular deep white matter both cerebral hemispheres as well as the pons, most consistent with chronic small vessel ischemic disease. Prior left suboccipital craniectomy for cavernoma resection again noted. Underlying encephalomalacia and gliosis with calcifications and probable chronic hemorrhagic blood products noted. No abnormal foci of restricted diffusion to suggest acute or subacute ischemia. Gray-white matter differentiation otherwise maintained. No other areas of remote cortical infarction. No other evidence for acute or chronic intracranial hemorrhage. No mass lesion, midline shift or mass effect. No hydrocephalus or extra-axial fluid collection. Pituitary gland suprasellar region within normal limits. Midline structures intact. Vascular: Major intracranial vascular flow voids are maintained. Skull and upper cervical spine: Craniocervical junction within normal limits. Bone marrow signal intensity normal. No scalp soft tissue abnormality. Sinuses/Orbits: Prior bilateral ocular lens replacement. Globes orbital soft tissues demonstrate no acute finding. Paranasal sinuses are clear. No mastoid effusion. Inner ear structures grossly normal. Other: None. IMPRESSION: 1. No acute intracranial abnormality. 2. Prior left suboccipital craniectomy for cavernoma resection with underlying chronic changes within the left cerebellar hemisphere. 3. Age-related cerebral atrophy with moderate chronic microvascular ischemic disease. Electronically Signed   By: Rise Mu M.D.   On: 07/23/2021 22:29   DG Chest Port 1 View  Result Date: 07/23/2021 CLINICAL DATA:  Weakness. EXAM: PORTABLE CHEST 1 VIEW COMPARISON:  Chest x-ray 05/14/2020. FINDINGS: The heart size and mediastinal contours are within normal limits. Both lungs are clear. The visualized skeletal structures are unremarkable. IMPRESSION: No active  disease. Electronically Signed   By: Darliss Cheney M.D.   On: 07/23/2021 18:52    Procedures Procedures   Medications Ordered in ED Medications - No data to display  ED Course  I have reviewed the triage vital signs and the nursing notes.  Pertinent labs & imaging results that were available during my care of the patient were reviewed by me and considered in my medical decision making (see chart for details).    MDM Rules/Calculators/A&P                           Patient actually walking well here.  Urinalysis negative for urinary tract infection.  BNP is not elevated.  Electrolytes without any significant abnormalities creatinine up a little bit GFR 45.  No leukocytosis no significant anemia.  Hemoglobin is 11.9.  Chest x-ray  without any acute findings.  Head CT without any acute findings.  Had a long delay waiting on the MRI.  MRI has resulted.  Shows no acute intracranial abnormality prior left suboccipital craniotomy for cavernoma resection with underlying chronic changes within the left cerebral hemisphere.  But nothing acute.  Patient stable for discharge home and follow-up with primary care provider.   Final Clinical Impression(s) / ED Diagnoses Final diagnoses:  Generalized weakness  Moderate dementia without behavioral disturbance, psychotic disturbance, mood disturbance, or anxiety, unspecified dementia type    Rx / DC Orders ED Discharge Orders     None        Vanetta Mulders, MD 07/23/21 2237

## 2021-07-23 NOTE — ED Notes (Signed)
Patient transported to MRI 

## 2021-07-23 NOTE — Discharge Instructions (Addendum)
Work-up without any acute findings to explain the increased weakness and some of the gait abnormalities.  However walking well here tonight.  MRI brain without any acute findings.  Labs without any significant abnormalities.  Urinalysis negative for urinary tract infection.  Follow back up with primary care provider.

## 2021-07-23 NOTE — ED Provider Notes (Signed)
Emergency Medicine Provider Triage Evaluation Note  Monica Edwards , a 80 y.o. female  was evaluated in triage.  Pt complains of decline in ambulation and coordination over the past month..  Review of Systems  Positive: Shuffling gait Negative: No chest pain or shortness of breath  Physical Exam  BP 126/71 (BP Location: Right Arm)   Pulse 67   Temp 98 F (36.7 C) (Oral)   Resp 18   SpO2 98%  Gen:   Awake, no distress   Resp:  Normal effort  MSK:   Moves extremities without difficulty  Other:    Medical Decision Making  Medically screening exam initiated at 11:39 AM.  Appropriate orders placed.  Monica Edwards was informed that the remainder of the evaluation will be completed by another provider, this initial triage assessment does not replace that evaluation, and the importance of remaining in the ED until their evaluation is complete.     Terrilee Files, MD 07/23/21 [phone]

## 2021-08-26 DIAGNOSIS — R079 Chest pain, unspecified: Secondary | ICD-10-CM | POA: Diagnosis not present

## 2021-08-26 DIAGNOSIS — Z23 Encounter for immunization: Secondary | ICD-10-CM | POA: Diagnosis not present

## 2021-08-26 DIAGNOSIS — R634 Abnormal weight loss: Secondary | ICD-10-CM | POA: Diagnosis not present

## 2021-08-26 DIAGNOSIS — Z79899 Other long term (current) drug therapy: Secondary | ICD-10-CM | POA: Diagnosis not present

## 2021-08-26 DIAGNOSIS — E559 Vitamin D deficiency, unspecified: Secondary | ICD-10-CM | POA: Diagnosis not present

## 2021-08-26 DIAGNOSIS — Z0001 Encounter for general adult medical examination with abnormal findings: Secondary | ICD-10-CM | POA: Diagnosis not present

## 2021-08-26 DIAGNOSIS — E876 Hypokalemia: Secondary | ICD-10-CM | POA: Diagnosis not present

## 2021-08-26 DIAGNOSIS — F03918 Unspecified dementia, unspecified severity, with other behavioral disturbance: Secondary | ICD-10-CM | POA: Diagnosis not present

## 2021-08-26 DIAGNOSIS — E039 Hypothyroidism, unspecified: Secondary | ICD-10-CM | POA: Diagnosis not present

## 2021-12-07 ENCOUNTER — Emergency Department: Payer: Medicare Other

## 2021-12-07 ENCOUNTER — Other Ambulatory Visit: Payer: Self-pay

## 2021-12-07 ENCOUNTER — Observation Stay
Admission: EM | Admit: 2021-12-07 | Discharge: 2021-12-08 | Disposition: A | Discharge status: Home / Self Care | Payer: Medicare Other | Attending: Internal Medicine | Admitting: Internal Medicine

## 2021-12-07 DIAGNOSIS — Z20822 Contact with and (suspected) exposure to covid-19: Secondary | ICD-10-CM | POA: Diagnosis not present

## 2021-12-07 DIAGNOSIS — E8721 Acute metabolic acidosis: Secondary | ICD-10-CM | POA: Insufficient documentation

## 2021-12-07 DIAGNOSIS — R0989 Other specified symptoms and signs involving the circulatory and respiratory systems: Secondary | ICD-10-CM

## 2021-12-07 DIAGNOSIS — Z79899 Other long term (current) drug therapy: Secondary | ICD-10-CM | POA: Insufficient documentation

## 2021-12-07 DIAGNOSIS — N1831 Chronic kidney disease, stage 3a: Secondary | ICD-10-CM | POA: Diagnosis not present

## 2021-12-07 DIAGNOSIS — K529 Noninfective gastroenteritis and colitis, unspecified: Secondary | ICD-10-CM

## 2021-12-07 DIAGNOSIS — R17 Unspecified jaundice: Secondary | ICD-10-CM | POA: Insufficient documentation

## 2021-12-07 DIAGNOSIS — F0390 Unspecified dementia without behavioral disturbance: Secondary | ICD-10-CM | POA: Diagnosis not present

## 2021-12-07 DIAGNOSIS — R531 Weakness: Secondary | ICD-10-CM | POA: Diagnosis not present

## 2021-12-07 DIAGNOSIS — I517 Cardiomegaly: Secondary | ICD-10-CM | POA: Diagnosis not present

## 2021-12-07 DIAGNOSIS — E871 Hypo-osmolality and hyponatremia: Secondary | ICD-10-CM | POA: Diagnosis not present

## 2021-12-07 DIAGNOSIS — R001 Bradycardia, unspecified: Secondary | ICD-10-CM | POA: Diagnosis not present

## 2021-12-07 DIAGNOSIS — F39 Unspecified mood [affective] disorder: Secondary | ICD-10-CM | POA: Diagnosis present

## 2021-12-07 DIAGNOSIS — G9341 Metabolic encephalopathy: Secondary | ICD-10-CM

## 2021-12-07 DIAGNOSIS — F03B18 Unspecified dementia, moderate, with other behavioral disturbance: Secondary | ICD-10-CM

## 2021-12-07 DIAGNOSIS — F03B0 Unspecified dementia, moderate, without behavioral disturbance, psychotic disturbance, mood disturbance, and anxiety: Secondary | ICD-10-CM

## 2021-12-07 DIAGNOSIS — R1110 Vomiting, unspecified: Secondary | ICD-10-CM | POA: Diagnosis not present

## 2021-12-07 DIAGNOSIS — F32A Depression, unspecified: Secondary | ICD-10-CM | POA: Diagnosis present

## 2021-12-07 DIAGNOSIS — E872 Acidosis, unspecified: Secondary | ICD-10-CM

## 2021-12-07 DIAGNOSIS — J9811 Atelectasis: Secondary | ICD-10-CM | POA: Diagnosis not present

## 2021-12-07 LAB — CBC WITH DIFFERENTIAL/PLATELET
Abs Immature Granulocytes: 0.04 10*3/uL (ref 0.00–0.07)
Basophils Absolute: 0 10*3/uL (ref 0.0–0.1)
Basophils Relative: 0 %
Eosinophils Absolute: 0 10*3/uL (ref 0.0–0.5)
Eosinophils Relative: 0 %
HCT: 35.7 % — ABNORMAL LOW (ref 36.0–46.0)
Hemoglobin: 11.8 g/dL — ABNORMAL LOW (ref 12.0–15.0)
Immature Granulocytes: 0 %
Lymphocytes Relative: 14 %
Lymphs Abs: 1.3 10*3/uL (ref 0.7–4.0)
MCH: 29.2 pg (ref 26.0–34.0)
MCHC: 33.1 g/dL (ref 30.0–36.0)
MCV: 88.4 fL (ref 80.0–100.0)
Monocytes Absolute: 0.5 10*3/uL (ref 0.1–1.0)
Monocytes Relative: 5 %
Neutro Abs: 7.4 10*3/uL (ref 1.7–7.7)
Neutrophils Relative %: 81 %
Platelets: 231 10*3/uL (ref 150–400)
RBC: 4.04 MIL/uL (ref 3.87–5.11)
RDW: 13.8 % (ref 11.5–15.5)
WBC: 9.3 10*3/uL (ref 4.0–10.5)
nRBC: 0 % (ref 0.0–0.2)

## 2021-12-07 LAB — URINALYSIS, ROUTINE W REFLEX MICROSCOPIC
Bilirubin Urine: NEGATIVE
Glucose, UA: NEGATIVE mg/dL
Ketones, ur: NEGATIVE mg/dL
Leukocytes,Ua: NEGATIVE
Nitrite: NEGATIVE
Protein, ur: NEGATIVE mg/dL
Specific Gravity, Urine: 1.003 — ABNORMAL LOW (ref 1.005–1.030)
Squamous Epithelial / LPF: NONE SEEN (ref 0–5)
pH: 5 (ref 5.0–8.0)

## 2021-12-07 LAB — COMPREHENSIVE METABOLIC PANEL
ALT: 16 U/L (ref 0–44)
AST: 35 U/L (ref 15–41)
Albumin: 4.4 g/dL (ref 3.5–5.0)
Alkaline Phosphatase: 55 U/L (ref 38–126)
Anion gap: 12 (ref 5–15)
BUN: 15 mg/dL (ref 8–23)
CO2: 18 mmol/L — ABNORMAL LOW (ref 22–32)
Calcium: 9 mg/dL (ref 8.9–10.3)
Chloride: 90 mmol/L — ABNORMAL LOW (ref 98–111)
Creatinine, Ser: 1.21 mg/dL — ABNORMAL HIGH (ref 0.44–1.00)
GFR, Estimated: 45 mL/min — ABNORMAL LOW (ref >60)
Glucose, Bld: 117 mg/dL — ABNORMAL HIGH (ref 70–99)
Potassium: 4.6 mmol/L (ref 3.5–5.1)
Sodium: 120 mmol/L — ABNORMAL LOW (ref 135–145)
Total Bilirubin: 1.9 mg/dL — ABNORMAL HIGH (ref 0.3–1.2)
Total Protein: 7.4 g/dL (ref 6.5–8.1)

## 2021-12-07 LAB — SODIUM, URINE, RANDOM: Sodium, Ur: 43 mmol/L

## 2021-12-07 LAB — RESP PANEL BY RT-PCR (FLU A&B, COVID) ARPGX2
Influenza A by PCR: NEGATIVE
Influenza B by PCR: NEGATIVE
SARS Coronavirus 2 by RT PCR: NEGATIVE

## 2021-12-07 LAB — TROPONIN I (HIGH SENSITIVITY)
Troponin I (High Sensitivity): 10 ng/L (ref <18)
Troponin I (High Sensitivity): 14 ng/L (ref <18)

## 2021-12-07 LAB — SODIUM: Sodium: 126 mmol/L — ABNORMAL LOW (ref 135–145)

## 2021-12-07 LAB — OSMOLALITY: Osmolality: 272 mOsm/kg — ABNORMAL LOW (ref 275–295)

## 2021-12-07 LAB — OSMOLALITY, URINE: Osmolality, Ur: 171 mOsm/kg — ABNORMAL LOW (ref 300–900)

## 2021-12-07 MED ORDER — ONDANSETRON HCL 4 MG/2ML IJ SOLN: 4.0000 mg | Freq: Four times a day (QID) | INTRAMUSCULAR | Status: DC | PRN

## 2021-12-07 MED ORDER — ONDANSETRON HCL 4 MG/2ML IJ SOLN
4.0000 mg | Freq: Once | INTRAMUSCULAR | Status: AC
  Administered 2021-12-07: 4 mg via INTRAVENOUS
  Filled 2021-12-07: qty 2

## 2021-12-07 MED ORDER — ONDANSETRON HCL 4 MG PO TABS: 4.0000 mg | ORAL_TABLET | Freq: Four times a day (QID) | ORAL | Status: DC | PRN

## 2021-12-07 MED ORDER — ACETAMINOPHEN 650 MG RE SUPP: 650.0000 mg | Freq: Four times a day (QID) | RECTAL | Status: DC | PRN

## 2021-12-07 MED ORDER — SODIUM CHLORIDE 0.9 % IV BOLUS
1000.0000 mL | Freq: Once | INTRAVENOUS | Status: AC
  Administered 2021-12-07: 1000 mL via INTRAVENOUS

## 2021-12-07 MED ORDER — SODIUM CHLORIDE 0.9 % IV BOLUS
500.0000 mL | Freq: Once | INTRAVENOUS | Status: AC
  Administered 2021-12-07: 500 mL via INTRAVENOUS

## 2021-12-07 MED ORDER — ENOXAPARIN SODIUM 40 MG/0.4ML IJ SOSY
40.0000 mg | PREFILLED_SYRINGE | Freq: Every day | INTRAMUSCULAR | Status: DC
  Administered 2021-12-07: 40 mg via SUBCUTANEOUS
  Filled 2021-12-07: qty 0.4

## 2021-12-07 MED ORDER — ACETAMINOPHEN 325 MG PO TABS: 650.0000 mg | ORAL_TABLET | Freq: Four times a day (QID) | ORAL | Status: DC | PRN

## 2021-12-07 NOTE — ED Provider Notes (Signed)
[facility] Provider Note    Event Date/Time   First MD Initiated Contact with Patient 12/07/21 1943     (approximate)   History   Vomiting   HPI  Level 5 caveat: History of present illness limited due to dementia  Monica Edwards is a 81 y.o. female with a history of dementia and prior hyponatremia presents with change in mental status today.  Her caregiver noted that the patient seemed slower to respond, was not eating, and required more assistance than usual.  She seemed "out of it."  The patient then started having vomiting and has had a few episodes of diarrhea today.  She denies abdominal pain.   Physical Exam   Triage Vital Signs: ED Triage Vitals  Enc Vitals Group     BP 12/07/21 1716 140/67     Pulse Rate 12/07/21 1716 (!) 59     Resp 12/07/21 1716 16     Temp 12/07/21 1716 98.1 F (36.7 C)     Temp Source 12/07/21 1716 Oral     SpO2 12/07/21 1716 97 %     Weight 12/07/21 1717 136 lb 0.4 oz (61.7 kg)     Height 12/07/21 1717 5\' 1"  (1.549 m)     Head Circumference --      Peak Flow --      Pain Score 12/07/21 1717 0     Pain Loc --      Pain Edu? --      Excl. in GC? --     Most recent vital signs: Vitals:   12/07/21 1716 12/07/21 1830  BP: 140/67 (!) 158/68  Pulse: (!) 59 90  Resp: 16 17  Temp: 98.1 F (36.7 C) 98.5 F (36.9 C)  SpO2: 97% 100%     General: Alert, somewhat confused.  Comfortable appearing. CV:  Good peripheral perfusion.  Resp:  Normal effort.  Abd:  Soft and nontender.  No distention.  Other:  Motor intact in all extremities.  EOMI.  PERRLA.  Slightly dry mucous membranes.   ED Results / Procedures / Treatments   Labs (all labs ordered are listed, but only abnormal results are displayed) Labs Reviewed  COMPREHENSIVE METABOLIC PANEL - Abnormal; Notable for the following components:      Result Value   Sodium 120 (*)    Chloride 90 (*)    CO2 18 (*)    Glucose, Bld 117 (*)    Creatinine, Ser 1.21  (*)    Total Bilirubin 1.9 (*)    GFR, Estimated 45 (*)    All other components within normal limits  CBC WITH DIFFERENTIAL/PLATELET - Abnormal; Notable for the following components:   Hemoglobin 11.8 (*)    HCT 35.7 (*)    All other components within normal limits  URINALYSIS, ROUTINE W REFLEX MICROSCOPIC - Abnormal; Notable for the following components:   Color, Urine YELLOW (*)    APPearance HAZY (*)    Specific Gravity, Urine 1.003 (*)    Hgb urine dipstick SMALL (*)    Bacteria, UA RARE (*)    All other components within normal limits  RESP PANEL BY RT-PCR (FLU A&B, COVID) ARPGX2  TROPONIN I (HIGH SENSITIVITY)  TROPONIN I (HIGH SENSITIVITY)     EKG  ED ECG REPORT I, 12/09/21, the attending physician, personally viewed and interpreted this ECG.  Date: 12/07/2021 EKG Time: 1722 Rate: 57 Rhythm: normal sinus rhythm QRS Axis: normal Intervals: normal ST/T Wave abnormalities: normal Narrative  Interpretation: no evidence of acute ischemia    RADIOLOGY  Chest x-ray: I independently viewed and interpreted the images; there is has no focal consolidation or edema  CT head: I independently viewed and interpreted the images; there is no evidence of ICH.  Radiology report confirms no acute abnormality.  PROCEDURES:  Critical Care performed: No  Procedures   MEDICATIONS ORDERED IN ED: Medications  sodium chloride 0.9 % bolus 500 mL (has no administration in time range)  ondansetron (ZOFRAN) injection 4 mg (4 mg Intravenous Given 12/07/21 1724)  sodium chloride 0.9 % bolus 1,000 mL (0 mLs Intravenous Stopped 12/07/21 1847)     IMPRESSION / MDM / ASSESSMENT AND PLAN / ED COURSE  I reviewed the triage vital signs and the nursing notes.  81 year old female with a history of dementia presents with somewhat altered mental status today associated with vomiting and diarrhea but no abdominal pain.  On exam the patient is overall comfortable appearing.  She is  alert but slightly confused.  Her vital signs are normal.  The abdomen is soft and nontender.  Neurologic exam is nonfocal.  I discussed the patient's care with her daughter who is present at the bedside.  She confirms that the patient seems more confused than normal.  She also states that the patient has not been eating and drinking well over weeks to months.  Lab work-up is significant for hyponatremia, with a sodium of 120.  CBC is normal.  Urinalysis is unremarkable.  Respiratory panel is negative.  Troponin is normal.    Overall presentation is most consistent with hyponatremia.  This is likely acute on chronic.  I suspect that the patient Lantz have been somewhat hyponatremic and then is likely having gastroenteritis or gastritis on top of this causing the vomiting.    We will give fluids and plan for admission.  There is no indication for abdominal imaging candidate in the reassuring exam.  The patient is on the cardiac monitor to evaluate for evidence of arrhythmia and/or significant heart rate changes.  ----------------------------------------- 7:59 PM on 12/07/2021 -----------------------------------------  Patient appears comfortable on reassessment.  I consulted Dr. Para March from the hospitalist service; based on our discussion she agrees to admit the patient    FINAL CLINICAL IMPRESSION(S) / ED DIAGNOSES   Final diagnoses:  Respiratory symptoms     Rx / DC Orders   ED Discharge Orders     None        Note:  This document was prepared using Dragon voice recognition software and Ronda include unintentional dictation errors.    Dionne Bucy, MD 12/07/21 [phone]

## 2021-12-07 NOTE — H&P (Signed)
History and Physical    Patient: Monica Edwards NOT:[id number] DOB: 08/05/1941 DOA: 12/07/2021 DOS: the patient was seen and examined on 12/07/2021 PCP: Marden Noble, MD  Patient coming from: ALF/ILF  Chief Complaint:  Chief Complaint  Patient presents with   Vomiting    HPI: Monica Edwards is a 81 y.o. female with medical history significant of Dementia who was brought to ED due to a change in mental status described as slowness to respond, decreased interactiveness and needing assistance with eating, and activities of daily living which is unusual for her.  During the course of the day she started having nonbloody nonbilious vomiting and also had a few episodes of nonbloody and nonmelanotic stool.  She denies abdominal pain.  Has had no fever or chills.  Denies chest pain or shortness of breath  ED course: Afebrile, BP 140/67, pulse 59 respirations 16 with O2 sat 97% on room air Blood work significant for sodium 120, creatinine 1.21 which is baseline, bicarb 18.  Total bili elevated at 1.9 CBC with normal WBC of 9300, hemoglobin 11.8 Urinalysis with specific gravity 1.003 otherwise unremarkable Troponin 10 COVID and flu negative  EKG, personally viewed and interpreted: Sinus bradycardia at 57 with no acute ST-T wave changes Imaging: CT head nonacute.  Sequelae of prior left suboccipital craniotomy with unchanged encephalomalacia Chest x-ray right basilar atelectasis  Patient given a 1500 mL normal saline bolus.  Hospitalist consulted for admission.   Data Reviewed: Relevant notes from primary care and specialist visits, past discharge summaries as available in EHR, including Care Everywhere. Prior diagnostic testing as pertinent to current admission diagnoses Updated medications and problem lists for reconciliation ED course, including vitals, labs, imaging, treatment and response to treatment Triage notes, nursing and pharmacy notes and ED provider's notes Notable results as noted in  HPI   Review of Systems: As mentioned in the history of present illness. All other systems reviewed and are negative. Past Medical History:  Diagnosis Date   Allergy    Dementia (HCC)    Depression    Osteoporosis 2006   Thyroid disease    Past Surgical History:  Procedure Laterality Date   brain surgery angionoma  1988   CHOLECYSTECTOMY  06/15/2004   spondylosis  08/26/2003   L-S   Social History:  reports that she has never smoked. She has never used smokeless tobacco. She reports that she does not drink alcohol and does not use drugs.  Allergies  Allergen Reactions   Alendronate Sodium     REACTION: intolerant   Ezetimibe-Simvastatin     REACTION: side effects   Pravastatin Sodium     REACTION: intolerant   Relafen [Nabumetone]     Chest pain   Risedronate Sodium     REACTION: intolerant    No family history on file.  Prior to Admission medications   Medication Sig Start Date End Date Taking? Authorizing Provider  Biotin 5000 MCG CAPS Take 1 capsule by mouth daily.      [provider]  Calcium Carbonate-Vit D-Min 600-400 MG-UNIT TABS Take 1 tablet by mouth daily.      [provider]  cholecalciferol (VITAMIN D) 1000 UNITS tablet Take 1,000 Units by mouth 3 (three) times daily.    [provider]  cyanocobalamin (,VITAMIN B-12,) 1000 MCG/ML injection Inject 1,000 mcg into the muscle every 30 (thirty) days.    [provider]  escitalopram (LEXAPRO) 20 MG tablet TAKE 1 TABLET BY MOUTH DAILY 07/15/15  Lucille Passy, MD  fish oil-omega-3 fatty acids 1000 MG capsule Take 1 g by mouth 3 (three) times daily.      [provider]  levothyroxine (SYNTHROID, LEVOTHROID) 100 MCG tablet Take 1 tablet (100 mcg total) by mouth daily. 01/23/15   Lucille Passy, MD  meclizine (ANTIVERT) 25 MG tablet Take 25 mg by mouth 3 (three) times daily as needed.    [provider]  Multiple Vitamin (MULTIVITAMIN PO) Take 1 tablet by mouth  daily.      [provider]  ranitidine (ZANTAC) 150 MG tablet TAKE 1 TABLET BY MOUTH TWICE A DAY 02/16/16   Lucille Passy, MD  vitamin B-12 (CYANOCOBALAMIN) 1000 MCG tablet Take 1,000 mcg by mouth daily.    [provider]  vitamin E 400 UNIT capsule Take 400 Units by mouth 2 (two) times daily.    [provider]    Physical Exam: Vitals:   12/07/21 1716 12/07/21 1717 12/07/21 1830  BP: 140/67  (!) 158/68  Pulse: (!) 59  90  Resp: 16  17  Temp: 98.1 F (36.7 C)  98.5 F (36.9 C)  TempSrc: Oral  Oral  SpO2: 97%  100%  Weight:  61.7 kg   Height:  5\' 1"  (1.549 m)    Physical Exam Vitals and nursing note reviewed.  Constitutional:      General: She is not in acute distress.    Appearance: Normal appearance.  HENT:     Head: Normocephalic and atraumatic.  Cardiovascular:     Rate and Rhythm: Normal rate and regular rhythm.     Pulses: Normal pulses.     Heart sounds: Normal heart sounds. No murmur heard. Pulmonary:     Effort: Pulmonary effort is normal.     Breath sounds: Normal breath sounds. No wheezing or rhonchi.  Abdominal:     General: Bowel sounds are normal.     Palpations: Abdomen is soft.     Tenderness: There is no abdominal tenderness.  Musculoskeletal:        General: No swelling or tenderness. Normal range of motion.     Cervical back: Normal range of motion and neck supple.  Skin:    General: Skin is warm and dry.  Neurological:     General: No focal deficit present.     Mental Status: She is alert. Mental status is at baseline.  Psychiatric:        Mood and Affect: Mood normal.        Behavior: Behavior normal.      Assessment and Plan: * Hyponatremia- (present on admission) Suspect hypovolemic secondary to developing vomiting and diarrhea Continue IV hydration with NS with goal to replace 8 mEq in 24 hours Serum sodiums Follow-up urine and sodium osmolality and urine sodium  Acute metabolic encephalopathy Secondary to  hyponatremia We will keep n.p.o. tonight Neurologic checks, fall and aspiration precautions  Acute gastroenteritis We will keep n.p.o. tonight IV hydration, IV antiemetics GI panel  Metabolic acidosis Mild metabolic acidosis with bicarb of 18 Likely related to diarrhea and vomiting Expecting improvement with hydration  Hyperbilirubinemia Bilirubin 1.9 of uncertain etiology.  LFTs otherwise unremarkable Consider imaging if uptrending  Stage 3a chronic kidney disease ([place]) Renal function at baseline  Dementia without behavioral disturbance (HCC) Delirium precautions Haldol as needed for behavioral disturbance if not improved with redirecting  Depression- (present on admission) Continue home Lexapro       Advance Care Planning:   Code  Status: Full Code   Consults: none  Family Communication: daughter at bedside  Severity of Illness: The appropriate patient status for this patient is INPATIENT. Inpatient status is judged to be reasonable and necessary in order to provide the required intensity of service to ensure the patient's safety. The patient's presenting symptoms, physical exam findings, and initial radiographic and laboratory data in the context of their chronic comorbidities is felt to place them at high risk for further clinical deterioration. Furthermore, it is not anticipated that the patient will be medically stable for discharge from the hospital within 2 midnights of admission.   * I certify that at the point of admission it is my clinical judgment that the patient will require inpatient hospital care spanning beyond 2 midnights from the point of admission due to high intensity of service, high risk for further deterioration and high frequency of surveillance required.*  Author: Athena Masse, MD 12/07/2021 8:50 PM  For on call review [email or web address].

## 2021-12-07 NOTE — Assessment & Plan Note (Signed)
Delirium precautions Haldol as needed for behavioral disturbance if not improved with redirecting

## 2021-12-07 NOTE — Assessment & Plan Note (Signed)
Continue home Lexapro. 

## 2021-12-07 NOTE — ED Notes (Signed)
Pt ambulated to bedside commode with standby assist. Attempting to provide urine specimen at this time.

## 2021-12-07 NOTE — Assessment & Plan Note (Signed)
Secondary to hyponatremia We will keep n.p.o. tonight Neurologic checks, fall and aspiration precautions

## 2021-12-07 NOTE — ED Provider Triage Note (Signed)
°  Emergency Medicine Provider Triage Evaluation Note  Monica Edwards , a 81 y.o.female,  was evaluated in triage.  Pt complains of altered mental status, vomiting.  Patient is joined by her mother, who states that the patient has been acting abnormally since this morning.  Reports weakness, decreased cognition, and difficulty doing daily tasks that are usually easy for her.  Additionally, the patient has been experiencing persistent vomiting.  The daughter notes that the patient has also had cough and congestion for the past couple weeks.  Denies any pain at this time.   Review of Systems  Positive: Vomiting Negative: Denies fever, chest pain, vomiting  Physical Exam  There were no vitals filed for this visit. Gen:   Awake, appears anxious. Resp:  Normal effort  MSK:   Moves extremities without difficulty  Other:  No gross neurological deficits.  Medical Decision Making  Given the patient's initial medical screening exam, the following diagnostic evaluation has been ordered. The patient will be placed in the appropriate treatment space, once one is available, to complete the evaluation and treatment. I have discussed the plan of care with the patient and I have advised the patient that an ED physician or mid-level practitioner will reevaluate their condition after the test results have been received, as the results Bat[place] give them additional insight into the type of treatment they [place] need.    Diagnostics: Labs, EKG, CXR, UA, head CT  Treatments: none immediately   Teodoro Spray, [place] 12/07/21 1717

## 2021-12-07 NOTE — Assessment & Plan Note (Addendum)
Clear liquid diet , IV antiemetics GI panel

## 2021-12-07 NOTE — Assessment & Plan Note (Signed)
Renal function at baseline 

## 2021-12-07 NOTE — Assessment & Plan Note (Signed)
Suspect hypovolemic secondary to developing vomiting and diarrhea Continue IV hydration with NS with goal to replace 8 mEq in 24 hours Serum sodiums Follow-up urine and sodium osmolality and urine sodium

## 2021-12-07 NOTE — ED Triage Notes (Signed)
Pt here with persistent vomiting. Pt has hx of dementia. Pt in NAD in triage.

## 2021-12-07 NOTE — Assessment & Plan Note (Signed)
Bilirubin 1.9 of uncertain etiology.  LFTs otherwise unremarkable Consider imaging if uptrending

## 2021-12-07 NOTE — Assessment & Plan Note (Signed)
Mild metabolic acidosis with bicarb of 18 Likely related to diarrhea and vomiting Expecting improvement with hydration

## 2021-12-08 DIAGNOSIS — R0989 Other specified symptoms and signs involving the circulatory and respiratory systems: Secondary | ICD-10-CM

## 2021-12-08 DIAGNOSIS — N1831 Chronic kidney disease, stage 3a: Secondary | ICD-10-CM | POA: Diagnosis not present

## 2021-12-08 DIAGNOSIS — E871 Hypo-osmolality and hyponatremia: Secondary | ICD-10-CM | POA: Diagnosis not present

## 2021-12-08 DIAGNOSIS — K529 Noninfective gastroenteritis and colitis, unspecified: Secondary | ICD-10-CM | POA: Diagnosis not present

## 2021-12-08 LAB — SODIUM
Sodium: 134 mmol/L — ABNORMAL LOW (ref 135–145)
Sodium: 138 mmol/L (ref 135–145)

## 2021-12-08 NOTE — Discharge Summary (Signed)
Physician Discharge Summary   Patient: Monica Edwards MRN: [id number] DOB: 02/03/1941  Admit date:     12/07/2021  Discharge date: 12/08/21  Discharge Physician: Delfino Lovett   PCP: Marden Noble, MD   Recommendations at discharge:   Follow-up with outpatient providers as requested  Discharge Diagnoses: Principal Problem:   Hyponatremia Active Problems:   Depression   Respiratory symptoms   Acute gastroenteritis   Dementia without behavioral disturbance (HCC)   Acute metabolic encephalopathy   Stage 3a chronic kidney disease (HCC)   Hyperbilirubinemia   Metabolic acidosis  Hospital Course: Assessment and Plan: * Hyponatremia- (present on admission) Suspect hypovolemic secondary to developing vomiting and diarrhea Improved with hydration  Metabolic acidosis Mild metabolic acidosis with bicarb of 18 Likely related to diarrhea and vomiting Resolved with hydration  Hyperbilirubinemia Bilirubin 1.9 of uncertain etiology.  LFTs otherwise unremarkable No clinical significance.  She is not symptomatic.  Daughter would like to defer any further testing can consider outpatient follow-up  Stage 3a chronic kidney disease (HCC) Renal function at baseline  Acute metabolic encephalopathy Secondary to hyponatremia With sodium improvement in her mental status is back to baseline  Dementia without behavioral disturbance (HCC) At baseline  Acute gastroenteritis Symptoms resolved.  She tolerated diet  Depression- (present on admission) Continue home Lexapro            Disposition: Home Diet recommendation:  Discharge Diet Orders (From admission, onward)     Start     Ordered   12/08/21 0000  Diet - low sodium heart healthy        12/08/21 0934           Cardiac diet  DISCHARGE MEDICATION: Allergies as of 12/08/2021       Reactions   Alendronate Sodium    REACTION: intolerant   Ezetimibe-simvastatin    REACTION: side effects   Pravastatin Sodium     REACTION: intolerant   Relafen [nabumetone]    Chest pain   Risedronate Sodium    REACTION: intolerant        Medication List     STOP taking these medications    escitalopram 20 MG tablet Commonly known as: LEXAPRO       TAKE these medications    donepezil 5 MG tablet Commonly known as: ARICEPT Take 5 mg by mouth at bedtime.   levothyroxine 125 MCG tablet Commonly known as: SYNTHROID Take 125 mcg by mouth every morning. What changed: Another medication with the same name was removed. Continue taking this medication, and follow the directions you see here.   memantine 28 MG Cp24 24 hr capsule Commonly known as: NAMENDA XR Take 28 mg by mouth at bedtime.   sertraline 50 MG tablet Commonly known as: ZOLOFT Take 50 mg by mouth daily.   vitamin B-12 1000 MCG tablet Commonly known as: CYANOCOBALAMIN Take 1,000 mcg by mouth daily.   Vitamin D 50 MCG (2000 UT) Caps Take 2,000 Units by mouth daily.        Follow-up Information     Marden Noble, MD. Schedule an appointment as soon as possible for a visit on 12/14/2021.   Specialty: Internal Medicine Why: [facility] Discharge @ 10:15am Contact information: [address] [place] [address] [phone]                 Discharge Exam: Ceasar Mons Weights   12/07/21 1717 12/07/21 2118  Weight: 61.7 kg 59.6 kg   Constitutional:  General: She is not in acute distress.    Appearance: Normal appearance.  HENT:     Head: Normocephalic and atraumatic.  Cardiovascular:     Rate and Rhythm: Normal rate and regular rhythm.     Pulses: Normal pulses.     Heart sounds: Normal heart sounds. No murmur heard. Pulmonary:     Effort: Pulmonary effort is normal.     Breath sounds: Normal breath sounds. No wheezing or rhonchi.  Abdominal:     General: Bowel sounds are normal.     Palpations: Abdomen is soft.     Tenderness: There is no abdominal tenderness.  Musculoskeletal:         General: No swelling or tenderness. Normal range of motion.     Cervical back: Normal range of motion and neck supple.  Skin:    General: Skin is warm and dry.  Neurological:     General: No focal deficit present.     Mental Status: She is alert. Mental status is at baseline.  Psychiatric:        Mood and Affect: Mood normal.        Behavior: Behavior normal.   Condition at discharge: fair  The results of significant diagnostics from this hospitalization (including imaging, microbiology, ancillary and laboratory) are listed below for reference.   Imaging Studies: DG Chest 1 View  Result Date: 12/07/2021 CLINICAL DATA:  Weakness, decreased cognition, difficulty doing daily tasks, persistent vomiting, history dementia EXAM: CHEST  1 VIEW COMPARISON:  Portable exam 1817 hours compared to 07/23/2021 FINDINGS: Borderline enlargement of cardiac silhouette. Mediastinal contours and pulmonary vascularity normal. RIGHT basilar atelectasis. Remaining lungs clear. No pulmonary infiltrate, pleural effusion, or pneumothorax. Bones demineralized. IMPRESSION: RIGHT basilar atelectasis. Electronically Signed   By: Ulyses Southward M.D.   On: 12/07/2021 18:26   CT Head Wo Contrast  Result Date: 12/07/2021 CLINICAL DATA:  Mental status change. EXAM: CT HEAD WITHOUT CONTRAST TECHNIQUE: Contiguous axial images were obtained from the base of the skull through the vertex without intravenous contrast. RADIATION DOSE REDUCTION: This exam was performed according to the departmental dose-optimization program which includes automated exposure control, adjustment of the mA and/or kV according to patient size and/or use of iterative reconstruction technique. COMPARISON:  Head CT and brain MRI 07/23/2021 FINDINGS: Brain: No acute intracranial hemorrhage. Sequela of prior left suboccipital craniotomy with unchanged encephalomalacia and calcification in the left cerebellar hemisphere. No hydrocephalus, evidence of acute ischemia,  subdural or extra-axial collection. Stable brain volume. Mild chronic small vessel ischemia. Vascular: No hyperdense vessel. Skull: No fracture or focal lesion. Sinuses/Orbits: Paranasal sinuses and mastoid air cells are clear. The visualized orbits are unremarkable. Bilateral cataract resection. Other: None. IMPRESSION: 1. No acute intracranial abnormality. 2. Sequela of prior left suboccipital craniotomy with unchanged encephalomalacia and calcification in the left cerebellar hemisphere. Age related atrophy with stable chronic small vessel ischemia. Electronically Signed   By: Narda Rutherford M.D.   On: 12/07/2021 17:58    Microbiology: Results for orders placed or performed during the hospital encounter of 12/07/21  Resp Panel by RT-PCR (Flu A&B, Covid) Nasopharyngeal Swab     Status: None   Collection Time: 12/07/21  5:18 PM   Specimen: Nasopharyngeal Swab; Nasopharyngeal(NP) swabs in vial transport medium  Result Value Ref Range Status   SARS Coronavirus 2 by RT PCR NEGATIVE NEGATIVE Final    Comment: (NOTE) SARS-CoV-2 target nucleic acids are NOT DETECTED.  The SARS-CoV-2 RNA is generally detectable in upper respiratory specimens during  the acute phase of infection. The lowest concentration of SARS-CoV-2 viral copies this assay can detect is 138 copies/mL. A negative result does not preclude SARS-Cov-2 infection and should not be used as the sole basis for treatment or other patient management decisions. A negative result [place] occur with  improper specimen collection/handling, submission of specimen other than nasopharyngeal swab, presence of viral mutation(s) within the areas targeted by this assay, and inadequate number of viral copies(<138 copies/mL). A negative result must be combined with clinical observations, patient history, and epidemiological information. The expected result is Negative.  Fact Sheet for Patients:  BloggerCourse.com  Fact Sheet for  Healthcare Providers:  SeriousBroker.it  This test is no t yet approved or cleared by the [place] FDA and  has been authorized for detection and/or diagnosis of SARS-CoV-2 by FDA under an Emergency Use Authorization (EUA). This EUA will remain  in effect (meaning this test can be used) for the duration of the COVID-19 declaration under Section 564(b)(1) of the Act, 21 U.S.C.section 360bbb-3(b)(1), unless the authorization is terminated  or revoked sooner.       Influenza A by PCR NEGATIVE NEGATIVE Final   Influenza B by PCR NEGATIVE NEGATIVE Final    Comment: (NOTE) The Xpert Xpress SARS-CoV-2/FLU/RSV plus assay is intended as an aid in the diagnosis of influenza from Nasopharyngeal swab specimens and should not be used as a sole basis for treatment. Nasal washings and aspirates are unacceptable for Xpert Xpress SARS-CoV-2/FLU/RSV testing.  Fact Sheet for Patients: BloggerCourse.com  Fact Sheet for Healthcare Providers: SeriousBroker.it  This test is not yet approved or cleared by the [place] FDA and has been authorized for detection and/or diagnosis of SARS-CoV-2 by FDA under an Emergency Use Authorization (EUA). This EUA will remain in effect (meaning this test can be used) for the duration of the COVID-19 declaration under Section 564(b)(1) of the Act, 21 U.S.C. section 360bbb-3(b)(1), unless the authorization is terminated or revoked.  Performed at [facility], [address] [place], [place] [address]     Labs: CBC: Recent Labs  Lab 12/07/21 1718  WBC 9.3  NEUTROABS 7.4  HGB 11.8*  HCT 35.7*  MCV 88.4  PLT 231   Basic Metabolic Panel: Recent Labs  Lab 12/07/21 1718 12/07/21 2140 12/08/21 0538 12/08/21 0837  NA 120* 126* 134* 138  K 4.6  --   --   --   CL 90*  --   --   --   CO2 18*  --   --   --   GLUCOSE 117*  --   --   --   BUN 15  --   --   --    CREATININE 1.21*  --   --   --   CALCIUM 9.0  --   --   --    Liver Function Tests: Recent Labs  Lab 12/07/21 1718  AST 35  ALT 16  ALKPHOS 55  BILITOT 1.9*  PROT 7.4  ALBUMIN 4.4   CBG: No results for input(s): GLUCAP in the last 168 hours.  Discharge time spent: greater than 30 minutes.  Signed: Delfino Lovett, MD [facility] 12/08/2021

## 2021-12-08 NOTE — TOC CM/SW Note (Signed)
Patient has orders to discharge home today. Chart reviewed. PCP is Marden Noble, MD. On room air. No wounds. Per daughter patient lives at home with caregivers 5 days per week. She stays with daughter on the weekends. No further concerns. CSW signing off.  Charlynn Court, CSW [phone]

## 2021-12-08 NOTE — Care Management Obs Status (Signed)
MEDICARE OBSERVATION STATUS NOTIFICATION   Patient Details  Name: Monica Edwards MRN: [id number] Date of Birth: May 22, 1941   Medicare Observation Status Notification Given:  Yes (Daughter signed.)    Margarito Liner, LCSW 12/08/2021, 10:09 AM

## 2021-12-08 NOTE — Care Management CC44 (Signed)
Condition Code 44 Documentation Completed  Patient Details  Name: Monica Edwards MRN: [id number] Date of Birth: Oct 04, 1941   Condition Code 44 given:  Yes Patient signature on Condition Code 44 notice:  Yes (Daughter signed.) Documentation of 2 MD's agreement:  Yes Code 44 added to claim:  Yes    Candie Chroman, LCSW 12/08/2021, 10:09 AM

## 2021-12-11 ENCOUNTER — Telehealth: Payer: Self-pay

## 2021-12-11 NOTE — Telephone Encounter (Signed)
Spoke with patient's daughter Jacobo Forest and scheduled a Mychart Palliative Consult for 12/15/21 @ 1PM.   Consent obtained; updated Outlook/Netsmart/Team List and Epic.

## 2021-12-15 ENCOUNTER — Telehealth: Payer: Self-pay | Admitting: Student

## 2021-12-15 ENCOUNTER — Other Ambulatory Visit: Payer: Self-pay

## 2021-12-15 DIAGNOSIS — Z515 Encounter for palliative care: Secondary | ICD-10-CM

## 2021-12-15 DIAGNOSIS — R630 Anorexia: Secondary | ICD-10-CM

## 2021-12-15 DIAGNOSIS — F0390 Unspecified dementia without behavioral disturbance: Secondary | ICD-10-CM

## 2021-12-15 NOTE — Progress Notes (Signed)
[facility] Consult Note Telephone: [phone]  Fax: [phone]   Date of encounter: 12/15/21 1:43 PM PATIENT NAME: Monica Edwards [id number]   [phone] (home)  DOB: 1941-04-07 MRN: [id number] PRIMARY CARE PROVIDER:    Josetta Huddle, MD,  [place] [place] [address] [phone]  REFERRING PROVIDER:   Josetta Huddle, MD [address] [place],   [address] [phone]  RESPONSIBLE PARTY:    Contact Information     Name Relation Home Work [place] Daughter [phone]   Barrales,Monica Edwards   [phone]       Due to the COVID-19 crisis, this visit was done via telemedicine from my office and it was initiated and consent by this patient and or family.  I connected with  Monica Edwards on 12/15/21 by a video enabled telemedicine application and verified that I am speaking with the correct person using two identifiers.   I discussed the limitations of evaluation and management by telemedicine. The patient expressed understanding and agreed to proceed.                                     ASSESSMENT AND PLAN / RECOMMENDATIONS:   Advance Care Planning/Goals of Care: Goals include to maximize quality of life and symptom management. Patient/health care surrogate gave his/her permission to discuss.Our advance care planning conversation included a discussion about:    The value and importance of advance care planning  Experiences with loved ones who have been seriously ill or have died  Exploration of personal, cultural or spiritual beliefs that might influence medical decisions  Exploration of goals of care in the event of a sudden injury or illness  Patient does have a Living Will Family would like to discuss code status further; patient does state she does not want to be kept on a machine.  CODE STATUS: Full  Code  Symptom Management/Plan:  Dementia- reorient and redirect as needed. Monitor for falls and safety. Caregivers to encourage adequate fluid and food intake. Continue donepezil QHS, memantine QHS, sertraline as directed.   Appetite- patients appetite has improved. Caregivers to encourage adequate food and fluids throughout the day.  Follow up Palliative Care Visit: Palliative care will continue to follow for complex medical decision making, advance care planning, and clarification of goals. Return in 8 weeks or prn.   This visit was coded based on medical decision making (MDM).  PPS: 60%  HOSPICE ELIGIBILITY/DIAGNOSIS: TBD  Chief Complaint: Palliative Medicine initial visit.   HISTORY OF PRESENT ILLNESS:  Monica Edwards is a 81 y.o. year old female  with Dementia without behavioral disturbances, depression, hyponatremia, acute gastroenteritis, CKD 3a, acute metabolic encephalopathy, hyperbilirubinemia, hospitalized 2/20 to 12/08/2021 due to hyponatremia.   Patient resides at home with good family and caregiver support. Caregivers are in the home 5 days a week (Monday-Friday) from 12 pm-9 pm. Daughter reports patient appetite being okay and she is drinking better. She does require cueing to eat and drink adequate fluids. Daughter has also started supplements for hydration. She requires cueing with adl's such as bathing and dressing. No recent falls. She is very forgetful per daughter. She is sleeping well at night. She denies pain, shortness of breath, nausea, constipation. A 10-point ROS is negative, except for there pertinent positives and  negatives detailed per the HPI.   History obtained from review of EMR, discussion with primary team, and interview with family, facility staff/caregiver and/or Monica Edwards.  I reviewed available labs, medications, imaging, studies and related documents from the EMR.  Records reviewed and summarized above.    Physical Exam:  Constitutional: NAD General:  frail appearing, WNWD EYES: anicteric sclera, lids intact, no discharge  ENMT: intact hearing, oral mucous membranes moist, dentition intact Pulmonary: no increased work of breathing, no cough, room air GU: deferred MSK: moves all extremities, ambulatory Skin: no rashes or wounds on visible skin Neuro: no generalized weakness Psych: non-anxious affect, A and O x 2, forgetful Hem/lymph/immuno: no widespread bruising CURRENT PROBLEM LIST:  Patient Active Problem List   Diagnosis Date Noted   Hyponatremia 12/07/2021   Acute gastroenteritis 12/07/2021   Dementia without behavioral disturbance ([place]) Q000111Q   Acute metabolic encephalopathy Q000111Q   Stage 3a chronic kidney disease ([place]) 12/07/2021   Hyperbilirubinemia Q000111Q   Metabolic acidosis Q000111Q   Back pain 12/16/2014   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   Diarrhea 08/28/2014   Other malaise and fatigue 05/15/2014   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   Memory loss 05/14/2013   Ear pain 01/08/2013   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   Hyperlipidemia 02/05/2011   Respiratory symptoms 12/03/2009   HEMATURIA, HX OF 11/12/2009   GERD 08/13/2009   MITRAL VALVE PROLAPSE, HX OF 04/17/2008   HYPOTHYROIDISM 01/18/2007   Depression 01/18/2007   VENOUS INSUFFICIENCY 01/18/2007   ALLERGIC RHINITIS 01/18/2007   LOW BACK PAIN, CHRONIC 01/18/2007   OSTEOPOROSIS 01/18/2007   PAST MEDICAL HISTORY:  Active Ambulatory Problems    Diagnosis Date Noted   HYPOTHYROIDISM 01/18/2007   Depression 01/18/2007   VENOUS INSUFFICIENCY 01/18/2007   ALLERGIC RHINITIS 01/18/2007   GERD 08/13/2009   LOW BACK PAIN, CHRONIC 01/18/2007   OSTEOPOROSIS 01/18/2007   Respiratory symptoms 12/03/2009   MITRAL VALVE PROLAPSE, HX OF 04/17/2008   HEMATURIA, HX OF 11/12/2009   Hyperlipidemia 02/05/2011   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   Ear pain 01/08/2013   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   Memory loss 05/14/2013    Other malaise and fatigue 05/15/2014   Diarrhea 08/28/2014   Back pain 12/16/2014   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   Hyponatremia 12/07/2021   Acute gastroenteritis 12/07/2021   Dementia without behavioral disturbance ([place]) Q000111Q   Acute metabolic encephalopathy Q000111Q   Stage 3a chronic kidney disease ([place]) 12/07/2021   Hyperbilirubinemia Q000111Q   Metabolic acidosis Q000111Q   Resolved Ambulatory Problems    Diagnosis Date Noted   CERUMEN IMPACTION, LEFT 01/30/2009   POSTNASAL DRIP SYNDROME 10/24/2007   RENAL INSUFFICIENCY, ACUTE 11/12/2009   HIP PAIN, LEFT 10/31/2008   SPONDYLOSIS, CERVICAL 01/18/2007   NECK PAIN 10/24/2007   FATIGUE 05/20/2008   CHEST PAIN 04/09/2008   NAUSEA AND VOMITING 07/06/2010   Other nonspecific abnormal serum enzyme levels 07/08/2010   Routine general medical examination at a health care facility 02/05/2011   Gynecologic exam normal 07/21/2011   Dysuria 09/24/2011   Leg pain 02/24/2012   Low back pain 11/03/2012   Left ear impacted cerumen 11/03/2012   Failed hearing screening 11/03/2012   Impacted cerumen of left ear 12/08/2012   UTI (urinary tract infection) 01/31/2013   Past Medical History:  Diagnosis Date   Allergy    Dementia ([place])    Osteoporosis 2006   Thyroid disease    SOCIAL HX:  Social History  Tobacco Use   Smoking status: Never   Smokeless tobacco: Never  Substance Use Topics   Alcohol use: No   FAMILY HX: No family history on file.    ALLERGIES:  Allergies  Allergen Reactions   Alendronate Sodium     REACTION: intolerant   Ezetimibe-Simvastatin     REACTION: side effects   Pravastatin Sodium     REACTION: intolerant   Relafen [Nabumetone]     Chest pain   Risedronate Sodium     REACTION: intolerant     PERTINENT MEDICATIONS:  Outpatient Encounter Medications as of 12/15/2021  Medication Sig   Cholecalciferol (VITAMIN D) 50 MCG (2000 UT) CAPS Take 2,000 Units by mouth  daily.   donepezil (ARICEPT) 5 MG tablet Take 5 mg by mouth at bedtime.   levothyroxine (SYNTHROID) 125 MCG tablet Take 125 mcg by mouth every morning.   memantine (NAMENDA XR) 28 MG CP24 24 hr capsule Take 28 mg by mouth at bedtime.   sertraline (ZOLOFT) 50 MG tablet Take 50 mg by mouth daily.   vitamin B-12 (CYANOCOBALAMIN) 1000 MCG tablet Take 1,000 mcg by mouth daily.   No facility-administered encounter medications on file as of 12/15/2021.   Thank you for the opportunity to participate in the care of Ms. Nappier.  The palliative care team will continue to follow. Please call our office at [phone] if we can be of additional assistance.   Ezekiel Slocumb, NP   COVID-19 PATIENT SCREENING TOOL Asked and negative response unless otherwise noted:  Have you had symptoms of covid, tested positive or been in contact with someone with symptoms/positive test in the past 5-10 days? No

## 2022-02-11 ENCOUNTER — Other Ambulatory Visit: Payer: Medicare Other | Admitting: Student

## 2022-02-11 DIAGNOSIS — F0390 Unspecified dementia without behavioral disturbance: Secondary | ICD-10-CM

## 2022-02-11 DIAGNOSIS — Z515 Encounter for palliative care: Secondary | ICD-10-CM | POA: Diagnosis not present

## 2022-02-11 DIAGNOSIS — R630 Anorexia: Secondary | ICD-10-CM

## 2022-02-11 NOTE — Progress Notes (Signed)
? ? ?[facility] ?Community Palliative Care Consult Note ?Telephone: [phone]  ?Fax: [phone]  ? ? ?Date of encounter: 02/11/22 ?2:12 PM ?PATIENT NAME: Monica Edwards ?[address] [address]   ?[phone] (home)  ?DOB: 1941-05-18 ?MRN: [id number] ?PRIMARY CARE PROVIDER:    ?Josetta Huddle, MD,  ?[address] ?[place] [address] ?[phone] ? ?REFERRING PROVIDER:   ?Josetta Huddle, MD ?[place] ?[address] ?[place],   [address] ?[phone] ? ?RESPONSIBLE PARTY:    ?Contact Information   ? ? Name Relation Home Work Mobile  ? Horton,Terisa Daughter [phone]  ? Tacey,Terry Son   [phone]  ? ?  ? ? ? ?I met face to face with patient and caregiver in the home. Palliative Care was asked to follow this patient by consultation request of  Josetta Huddle, MD to address advance care planning and complex medical decision making. This is a follow up visit. ? ?                                 ASSESSMENT AND PLAN / RECOMMENDATIONS:  ? ?Advance Care Planning/Goals of Care: Goals include to maximize quality of life and symptom management. Patient/health care surrogate gave his/her permission to discuss. ?CODE STATUS: Full Code ? ?Palliative Medicine will continue to provide ongoing support, monitor for changes, declines and provide symptom management as needed. ? ?Symptom Management/Plan: ? ?Dementia- reorient and redirect as needed. Monitor for falls and safety. Continue caregivers for support. Continue donepezil QHS, memantine QHS, sertraline as directed.  ? ?Appetite-patient with improved appetite. Weight has been stable. She is eating foods she enjoys. Caregivers continue to provide adequate fluids throughout the day.  ? ?Follow up Palliative Care Visit: Palliative care will continue to follow for complex medical decision making, advance care planning, and clarification of goals. Return in 8-12 weeks or prn. ? ?This visit was  coded based on medical decision making (MDM). ? ?PPS: 60% ? ?HOSPICE ELIGIBILITY/DIAGNOSIS: TBD ? ?Chief Complaint: Palliative Medicine follow up visit.  ? ?HISTORY OF PRESENT ILLNESS:  Monica Edwards is a 81 y.o. year old female  with Dementia without behavioral disturbances, depression, hyponatremia, acute gastroenteritis, CKD 3a, acute metabolic encephalopathy, hyperbilirubinemia, hospitalized 2/20 to 12/08/2021 due to hyponatremia.  ? ?Patient reports doing well. She denies pain, shortness of breath. She endorses a good appetite, drinking well. She has caregivers in the home 5 days a week. Patient ambulatory without assistive device. No falls reported. No recent infections, ED visits or hospitalizations. A 10-point ROS is negative, except for there pertinent positives and negatives detailed per the HPI.  ? ?History obtained from review of EMR, discussion with primary team, and interview with family, facility staff/caregiver and/or Ms. Galvan.  ?I reviewed available labs, medications, imaging, studies and related documents from the EMR.  Records reviewed and summarized above.  ? ?Physical Exam: ?Pulse 78, resp 16, b/p 108/60, sats 99% on room air ?Constitutional: NAD ?General: frail appearing, thin ?EYES: anicteric sclera, lids intact, no discharge  ?ENMT: intact hearing, oral mucous membranes moist, dentition intact ?CV: S1S2, RRR, no LE edema ?Pulmonary: LCTA, no increased work of breathing, no cough, room air ?Abdomen: normo-active BS + 4 quadrants, soft and non tender, no ascites ?GU: deferred ?MSK: no sarcopenia, moves all extremities, ambulatory ?Skin: warm and dry, no rashes or wounds on visible skin ?Neuro:  no generalized weakness, A & O  x 2, forgetful ?Psych: non-anxious affect, pleasant ?Hem/lymph/immuno: no widespread bruising ? ? ?Thank you for the opportunity to participate in the care of Ms. Ask.  The palliative care team will continue to follow. Please call our office at [phone] if we can be of  additional assistance.  ? ?Ezekiel Slocumb, NP  ? ?COVID-19 PATIENT SCREENING TOOL ?Asked and negative response unless otherwise noted:  ? ?Have you had symptoms of covid, tested positive or been in contact with someone with symptoms/positive test in the past 5-10 days? No ? ?

## 2022-02-24 DIAGNOSIS — E039 Hypothyroidism, unspecified: Secondary | ICD-10-CM | POA: Diagnosis not present

## 2022-02-24 DIAGNOSIS — F03918 Unspecified dementia, unspecified severity, with other behavioral disturbance: Secondary | ICD-10-CM | POA: Diagnosis not present

## 2022-02-24 DIAGNOSIS — R197 Diarrhea, unspecified: Secondary | ICD-10-CM | POA: Diagnosis not present

## 2022-02-24 DIAGNOSIS — E559 Vitamin D deficiency, unspecified: Secondary | ICD-10-CM | POA: Diagnosis not present

## 2022-02-24 DIAGNOSIS — E876 Hypokalemia: Secondary | ICD-10-CM | POA: Diagnosis not present

## 2022-05-06 ENCOUNTER — Telehealth: Payer: Self-pay

## 2022-05-06 NOTE — Telephone Encounter (Signed)
1136 am.  Phone call made to daughter Monica Edwards to complete a check in and offer a home visit with Palliative Care NP.  No answer.  Message left requesting a call back.

## 2022-05-09 ENCOUNTER — Other Ambulatory Visit: Payer: Self-pay

## 2022-05-09 DIAGNOSIS — R7989 Other specified abnormal findings of blood chemistry: Secondary | ICD-10-CM | POA: Diagnosis not present

## 2022-05-09 DIAGNOSIS — Z79899 Other long term (current) drug therapy: Secondary | ICD-10-CM | POA: Insufficient documentation

## 2022-05-09 DIAGNOSIS — E871 Hypo-osmolality and hyponatremia: Principal | ICD-10-CM | POA: Insufficient documentation

## 2022-05-09 DIAGNOSIS — R778 Other specified abnormalities of plasma proteins: Secondary | ICD-10-CM | POA: Insufficient documentation

## 2022-05-09 DIAGNOSIS — N183 Chronic kidney disease, stage 3 unspecified: Secondary | ICD-10-CM | POA: Insufficient documentation

## 2022-05-09 DIAGNOSIS — G9340 Encephalopathy, unspecified: Secondary | ICD-10-CM | POA: Insufficient documentation

## 2022-05-09 DIAGNOSIS — E039 Hypothyroidism, unspecified: Secondary | ICD-10-CM | POA: Insufficient documentation

## 2022-05-09 DIAGNOSIS — E860 Dehydration: Secondary | ICD-10-CM | POA: Insufficient documentation

## 2022-05-09 DIAGNOSIS — F0390 Unspecified dementia without behavioral disturbance: Secondary | ICD-10-CM | POA: Insufficient documentation

## 2022-05-09 DIAGNOSIS — Z9049 Acquired absence of other specified parts of digestive tract: Secondary | ICD-10-CM | POA: Diagnosis not present

## 2022-05-09 DIAGNOSIS — D631 Anemia in chronic kidney disease: Secondary | ICD-10-CM | POA: Insufficient documentation

## 2022-05-09 DIAGNOSIS — R531 Weakness: Secondary | ICD-10-CM | POA: Diagnosis not present

## 2022-05-09 DIAGNOSIS — R110 Nausea: Secondary | ICD-10-CM | POA: Diagnosis present

## 2022-05-09 DIAGNOSIS — R112 Nausea with vomiting, unspecified: Secondary | ICD-10-CM | POA: Diagnosis not present

## 2022-05-09 DIAGNOSIS — R001 Bradycardia, unspecified: Secondary | ICD-10-CM | POA: Diagnosis not present

## 2022-05-09 LAB — COMPREHENSIVE METABOLIC PANEL
ALT: 20 U/L (ref 0–44)
AST: 27 U/L (ref 15–41)
Albumin: 3.8 g/dL (ref 3.5–5.0)
Alkaline Phosphatase: 55 U/L (ref 38–126)
Anion gap: 8 (ref 5–15)
BUN: 14 mg/dL (ref 8–23)
CO2: 22 mmol/L (ref 22–32)
Calcium: 8 mg/dL — ABNORMAL LOW (ref 8.9–10.3)
Chloride: 98 mmol/L (ref 98–111)
Creatinine, Ser: 1.15 mg/dL — ABNORMAL HIGH (ref 0.44–1.00)
GFR, Estimated: 48 mL/min — ABNORMAL LOW (ref >60)
Glucose, Bld: 110 mg/dL — ABNORMAL HIGH (ref 70–99)
Potassium: 3.7 mmol/L (ref 3.5–5.1)
Sodium: 128 mmol/L — ABNORMAL LOW (ref 135–145)
Total Bilirubin: 1.1 mg/dL (ref 0.3–1.2)
Total Protein: 6.4 g/dL — ABNORMAL LOW (ref 6.5–8.1)

## 2022-05-09 LAB — LIPASE, BLOOD: Lipase: 34 U/L (ref 11–51)

## 2022-05-09 LAB — URINALYSIS, ROUTINE W REFLEX MICROSCOPIC
Bilirubin Urine: NEGATIVE
Glucose, UA: NEGATIVE mg/dL
Hgb urine dipstick: NEGATIVE
Ketones, ur: NEGATIVE mg/dL
Leukocytes,Ua: NEGATIVE
Nitrite: NEGATIVE
Protein, ur: NEGATIVE mg/dL
Specific Gravity, Urine: 1.003 — ABNORMAL LOW (ref 1.005–1.030)
pH: 6 (ref 5.0–8.0)

## 2022-05-09 LAB — TROPONIN I (HIGH SENSITIVITY): Troponin I (High Sensitivity): 30 ng/L — ABNORMAL HIGH (ref <18)

## 2022-05-09 MED ORDER — ONDANSETRON HCL 4 MG/2ML IJ SOLN: 4.0000 mg | Freq: Once | INTRAMUSCULAR | Status: AC

## 2022-05-09 MED ORDER — ONDANSETRON HCL 4 MG/2ML IJ SOLN
INTRAMUSCULAR | Status: AC
  Administered 2022-05-09: 4 mg via INTRAVENOUS
  Filled 2022-05-09: qty 2

## 2022-05-09 MED ORDER — SODIUM CHLORIDE 0.9 % IV BOLUS
1000.0000 mL | Freq: Once | INTRAVENOUS | Status: AC
  Administered 2022-05-09: 1000 mL via INTRAVENOUS

## 2022-05-09 NOTE — ED Provider Triage Note (Signed)
Emergency Medicine Provider Triage Evaluation Note  Monica Edwards , a 81 y.o. female  was evaluated in triage.  Patient presents with family member.  Patient lives at home, has dementia and is cared for by family members.  Family member states she has had nausea and vomiting team today.  This typically happens every few months where she becomes dehydrated and requires IV fluids.  No reports of chest pain, abdominal pain.  Patient is very nauseated Review of Systems  Positive: Nausea, vomiting Negative: Fevers  Physical Exam  BP (!) 149/72 (BP Location: Right Arm)   Pulse (!) 54   Temp 98.2 F (36.8 C) (Oral)   Resp 18   Ht 5\' 1"  (1.549 m)   Wt 59 kg   SpO2 98%   BMI 24.56 kg/m  Gen:   Awake, no distress   Resp:  Normal effort  MSK:   Moves extremities without difficulty  Other:    Medical Decision Making  Medically screening exam initiated at 8:30 PM.  Appropriate orders placed.  Ghada C Lora was informed that the remainder of the evaluation will be completed by another provider, this initial triage assessment does not replace that evaluation, and the importance of remaining in the ED until their evaluation is complete.     , Evon Slack 05/09/22 2031

## 2022-05-09 NOTE — ED Triage Notes (Signed)
First nurse note- Pt daughter states pt has had loss of appetite and vomiting today. She feels like pt is dehydrated. Pt has hx of dementia and daughter states " if were not there to make her eat, she just doesn't."  Pt denies chest pain, abd pain, or sob.  Daughter states pt is at baseline mental status. NAD noted at this time

## 2022-05-10 ENCOUNTER — Emergency Department: Payer: Medicare Other

## 2022-05-10 ENCOUNTER — Observation Stay
Admission: EM | Admit: 2022-05-10 | Discharge: 2022-05-10 | Disposition: A | Discharge status: Home / Self Care | Payer: Medicare Other | Attending: Internal Medicine | Admitting: Internal Medicine

## 2022-05-10 DIAGNOSIS — F0390 Unspecified dementia without behavioral disturbance: Secondary | ICD-10-CM | POA: Diagnosis present

## 2022-05-10 DIAGNOSIS — E039 Hypothyroidism, unspecified: Secondary | ICD-10-CM

## 2022-05-10 DIAGNOSIS — D631 Anemia in chronic kidney disease: Secondary | ICD-10-CM

## 2022-05-10 DIAGNOSIS — D509 Iron deficiency anemia, unspecified: Secondary | ICD-10-CM

## 2022-05-10 DIAGNOSIS — Z9049 Acquired absence of other specified parts of digestive tract: Secondary | ICD-10-CM | POA: Diagnosis not present

## 2022-05-10 DIAGNOSIS — R112 Nausea with vomiting, unspecified: Secondary | ICD-10-CM | POA: Diagnosis not present

## 2022-05-10 DIAGNOSIS — E860 Dehydration: Secondary | ICD-10-CM

## 2022-05-10 DIAGNOSIS — F03B18 Unspecified dementia, moderate, with other behavioral disturbance: Secondary | ICD-10-CM | POA: Diagnosis present

## 2022-05-10 DIAGNOSIS — R531 Weakness: Secondary | ICD-10-CM

## 2022-05-10 DIAGNOSIS — R7989 Other specified abnormal findings of blood chemistry: Secondary | ICD-10-CM

## 2022-05-10 DIAGNOSIS — F03B0 Unspecified dementia, moderate, without behavioral disturbance, psychotic disturbance, mood disturbance, and anxiety: Secondary | ICD-10-CM | POA: Diagnosis present

## 2022-05-10 DIAGNOSIS — R638 Other symptoms and signs concerning food and fluid intake: Secondary | ICD-10-CM

## 2022-05-10 DIAGNOSIS — E871 Hypo-osmolality and hyponatremia: Secondary | ICD-10-CM | POA: Diagnosis present

## 2022-05-10 DIAGNOSIS — R778 Other specified abnormalities of plasma proteins: Secondary | ICD-10-CM

## 2022-05-10 LAB — CBC
HCT: 34.3 % — ABNORMAL LOW (ref 36.0–46.0)
Hemoglobin: 11.4 g/dL — ABNORMAL LOW (ref 12.0–15.0)
MCH: 29.9 pg (ref 26.0–34.0)
MCHC: 33.2 g/dL (ref 30.0–36.0)
MCV: 90 fL (ref 80.0–100.0)
Platelets: 194 10*3/uL (ref 150–400)
RBC: 3.81 MIL/uL — ABNORMAL LOW (ref 3.87–5.11)
RDW: 12.9 % (ref 11.5–15.5)
WBC: 5.8 10*3/uL (ref 4.0–10.5)
nRBC: 0 % (ref 0.0–0.2)

## 2022-05-10 LAB — BASIC METABOLIC PANEL
Anion gap: 7 (ref 5–15)
BUN: 15 mg/dL (ref 8–23)
CO2: 25 mmol/L (ref 22–32)
Calcium: 8.5 mg/dL — ABNORMAL LOW (ref 8.9–10.3)
Chloride: 101 mmol/L (ref 98–111)
Creatinine, Ser: 1.26 mg/dL — ABNORMAL HIGH (ref 0.44–1.00)
GFR, Estimated: 43 mL/min — ABNORMAL LOW (ref >60)
Glucose, Bld: 104 mg/dL — ABNORMAL HIGH (ref 70–99)
Potassium: 4 mmol/L (ref 3.5–5.1)
Sodium: 133 mmol/L — ABNORMAL LOW (ref 135–145)

## 2022-05-10 LAB — BRAIN NATRIURETIC PEPTIDE: B Natriuretic Peptide: 215.6 pg/mL — ABNORMAL HIGH (ref 0.0–100.0)

## 2022-05-10 LAB — TROPONIN I (HIGH SENSITIVITY): Troponin I (High Sensitivity): 30 ng/L — ABNORMAL HIGH (ref <18)

## 2022-05-10 MED ORDER — ENOXAPARIN SODIUM 40 MG/0.4ML IJ SOSY
40.0000 mg | PREFILLED_SYRINGE | INTRAMUSCULAR | Status: DC
Start: 2022-05-10 — End: 2022-05-10

## 2022-05-10 MED ORDER — SERTRALINE HCL 50 MG PO TABS: 50.0000 mg | ORAL_TABLET | Freq: Every day | ORAL | Status: DC

## 2022-05-10 MED ORDER — VITAMIN B-12 1000 MCG PO TABS
1000.0000 ug | ORAL_TABLET | Freq: Every day | ORAL | Status: DC
Start: 2022-05-10 — End: 2022-05-10

## 2022-05-10 MED ORDER — ACETAMINOPHEN 325 MG RE SUPP: 650.0000 mg | Freq: Four times a day (QID) | RECTAL | Status: DC | PRN

## 2022-05-10 MED ORDER — ONDANSETRON HCL 4 MG PO TABS: 4.0000 mg | ORAL_TABLET | Freq: Four times a day (QID) | ORAL | Status: DC | PRN

## 2022-05-10 MED ORDER — ONDANSETRON HCL 4 MG/2ML IJ SOLN: 4.0000 mg | Freq: Four times a day (QID) | INTRAMUSCULAR | Status: DC | PRN

## 2022-05-10 MED ORDER — ACETAMINOPHEN 325 MG PO TABS: 650.0000 mg | ORAL_TABLET | Freq: Four times a day (QID) | ORAL | Status: DC | PRN

## 2022-05-10 MED ORDER — MEMANTINE HCL ER 28 MG PO CP24: 28.0000 mg | ORAL_CAPSULE | Freq: Every day | ORAL | Status: DC

## 2022-05-10 MED ORDER — LEVOTHYROXINE SODIUM 125 MCG PO TABS
125.0000 ug | ORAL_TABLET | Freq: Every morning | ORAL | Status: DC
  Filled 2022-05-10: qty 1

## 2022-05-10 MED ORDER — VITAMIN D 50 MCG (2000 UT) PO CAPS
2000.0000 [IU] | ORAL_CAPSULE | Freq: Every day | ORAL | Status: DC
Start: 2022-05-10 — End: 2022-05-10

## 2022-05-10 MED ORDER — SODIUM CHLORIDE 0.9 % IV SOLN: INTRAVENOUS | Status: DC

## 2022-05-10 MED ORDER — DONEPEZIL HCL 5 MG PO TABS
5.0000 mg | ORAL_TABLET | Freq: Every day | ORAL | Status: DC
Start: 2022-05-10 — End: 2022-05-10

## 2022-05-10 NOTE — Assessment & Plan Note (Addendum)
Decreased oral intake No stigmata of infection to explain decreased oral intake.  Continue to monitor oral intake.  Offer nutritional supplements Patient received an IV fluid bolus in the ED. Hydrate IV as needed if not taking orally Consider PT and nutritionist consults

## 2022-05-10 NOTE — ED Provider Notes (Signed)
Vitals:   05/09/22 1958 05/10/22 0152  BP: (!) 149/72 (!) 129/46  Pulse:  (!) 53  Resp:  16  Temp:  97.6 F (36.4 C)  SpO2:  100%     Patient resting comfortably, continues to show improvement.  She has been up ambulating back and forth to the bathroom without distress and feels much better.  She is at her normal mental status and back to her normal baseline per her daughter and she is alert recognize being in the hospital recognizes daughter.  She is in no distress with normal hemodynamics.  At this point daughter would feel comfortable taking her home, and would not want her admitted.  Originally plan for admission, but at this point patient is back to her baseline.    Discussed with daughter who is healthcare power of attorney, in keeping with the patient's goals of care, improvement in her status, and that she has around-the-clock care at her [facility] home this week comfortable with plan for discharge and close outpatient follow-up.  Careful return precautions discussed with the patient's daughter who will be taking her home   Sharyn Creamer, MD 05/10/22 [phone]

## 2022-05-10 NOTE — ED Provider Notes (Signed)
.     Sharyn Creamer, MD 05/10/22 [phone]

## 2022-05-10 NOTE — H&P (Signed)
History and Physical    Nonbillable note.  Patient's daughter stated she did not want to be admitted  Patient: Monica Edwards CLE:[id number] DOB: 09/22/1941 DOA: 05/10/2022 DOS: the patient was seen and examined on 05/10/2022 PCP: Marden Noble, MD  Patient coming from: Home  Chief Complaint:  Chief Complaint  Patient presents with   Nausea    HPI: Monica Edwards is a 81 y.o. female with medical history significant for Dementia who was brought to the ED with a concern for poor oral intake,  ED course and data review: vitals unremarkable except for mild bradycardia 54 Labs:  sodium initially 128 with creatinine 1.15.  Following saline bolus sodium improved to 133 and creatinine went up to 1.26.  Hemoglobin 11.4 WBC normal.  Troponin 30 and BNP 215 EKG personally viewed and interpreted with sinus bradycardia 54 no acute ST-T wave changes Abdominal x-ray negative   hospitalist consulted for admission as daughter felt uncomfortable taking her home.     Past Medical History:  Diagnosis Date   Allergy    Dementia (HCC)    Depression    Osteoporosis 2006   Thyroid disease    Past Surgical History:  Procedure Laterality Date   brain surgery angionoma  1988   CHOLECYSTECTOMY  06/15/2004   spondylosis  08/26/2003   L-S   Social History:  reports that she has never smoked. She has never used smokeless tobacco. She reports that she does not drink alcohol and does not use drugs.  Allergies  Allergen Reactions   Alendronate Sodium     REACTION: intolerant   Ezetimibe-Simvastatin     REACTION: side effects   Pravastatin Sodium     REACTION: intolerant   Relafen [Nabumetone]     Chest pain   Risedronate Sodium     REACTION: intolerant    No family history on file.  Prior to Admission medications   Medication Sig Start Date End Date Taking? Authorizing Provider  Cholecalciferol (VITAMIN D) 50 MCG (2000 UT) CAPS Take 2,000 Units by mouth daily.    [provider]   donepezil (ARICEPT) 5 MG tablet Take 5 mg by mouth at bedtime. 10/29/21   [provider]  levothyroxine (SYNTHROID) 125 MCG tablet Take 125 mcg by mouth every morning. 11/21/21   [provider]  memantine (NAMENDA XR) 28 MG CP24 24 hr capsule Take 28 mg by mouth at bedtime. 11/17/21   [provider]  sertraline (ZOLOFT) 50 MG tablet Take 50 mg by mouth daily. 10/06/21   [provider]  vitamin B-12 (CYANOCOBALAMIN) 1000 MCG tablet Take 1,000 mcg by mouth daily.    [provider]    Physical Exam: Vitals:   05/09/22 1955 05/09/22 1957 05/09/22 1958 05/10/22 0152  BP:   (!) 149/72 (!) 129/46  Pulse: (!) 54   (!) 53  Resp: 18   16  Temp: 98.2 F (36.8 C)   97.6 F (36.4 C)  TempSrc: Oral   Oral  SpO2: 98%   100%  Weight:  59 kg    Height:  5\' 1"  (1.549 m)     Physical Exam  Labs on Admission: I have personally reviewed following labs and imaging studies  CBC: Recent Labs  Lab 05/10/22 0133  WBC 5.8  HGB 11.4*  HCT 34.3*  MCV 90.0  PLT 194   Basic Metabolic Panel: Recent Labs  Lab 05/09/22 2229 05/10/22 0133  NA 128* 133*  K 3.7 4.0  CL 98 101  CO2 22 25  GLUCOSE 110* 104*  BUN 14 15  CREATININE 1.15* 1.26*  CALCIUM 8.0* 8.5*   GFR: Estimated Creatinine Clearance: 29.4 mL/min (A) (by C-G formula based on SCr of 1.26 mg/dL (H)). Liver Function Tests: Recent Labs  Lab 05/09/22 2229  AST 27  ALT 20  ALKPHOS 55  BILITOT 1.1  PROT 6.4*  ALBUMIN 3.8   Recent Labs  Lab 05/09/22 2229  LIPASE 34   No results for input(s): "AMMONIA" in the last 168 hours. Coagulation Profile: No results for input(s): "INR", "PROTIME" in the last 168 hours. Cardiac Enzymes: No results for input(s): "CKTOTAL", "CKMB", "CKMBINDEX", "TROPONINI" in the last 168 hours. BNP (last 3 results) No results for input(s): "PROBNP" in the last 8760 hours. HbA1C: No results for input(s): "HGBA1C" in the last 72 hours. CBG: No results for  input(s): "GLUCAP" in the last 168 hours. Lipid Profile: No results for input(s): "CHOL", "HDL", "LDLCALC", "TRIG", "CHOLHDL", "LDLDIRECT" in the last 72 hours. Thyroid Function Tests: No results for input(s): "TSH", "T4TOTAL", "FREET4", "T3FREE", "THYROIDAB" in the last 72 hours. Anemia Panel: No results for input(s): "VITAMINB12", "FOLATE", "FERRITIN", "TIBC", "IRON", "RETICCTPCT" in the last 72 hours. Urine analysis:    Component Value Date/Time   COLORURINE YELLOW (A) 05/09/2022 2139   APPEARANCEUR HAZY (A) 05/09/2022 2139   LABSPEC 1.003 (L) 05/09/2022 2139   PHURINE 6.0 05/09/2022 2139   GLUCOSEU NEGATIVE 05/09/2022 2139   HGBUR NEGATIVE 05/09/2022 2139   HGBUR small 11/12/2009 1000   BILIRUBINUR NEGATIVE 05/09/2022 2139   BILIRUBINUR n/a 01/31/2013 1637   KETONESUR NEGATIVE 05/09/2022 2139   PROTEINUR NEGATIVE 05/09/2022 2139   UROBILINOGEN 0.2 01/31/2013 1637   UROBILINOGEN 0.2 11/12/2009 1000   NITRITE NEGATIVE 05/09/2022 2139   LEUKOCYTESUR NEGATIVE 05/09/2022 2139    Radiological Exams on Admission: DG Abd Portable 2 Views  Result Date: 05/10/2022 CLINICAL DATA:  Nausea/vomiting evaluate for SBO EXAM: PORTABLE ABDOMEN - 2 VIEW COMPARISON:  None Available. FINDINGS: Nonobstructive bowel gas pattern. Cholecystectomy clips. Visualized osseous structures are within normal limits. IMPRESSION: Negative. Electronically Signed   By: Charline Bills M.D.   On: 05/10/2022 02:37     Data Reviewed: Relevant notes from primary care and specialist visits, past discharge summaries as available in EHR, including Care Everywhere. Prior diagnostic testing as pertinent to current admission diagnoses Updated medications and problem lists for reconciliation ED course, including vitals, labs, imaging, treatment and response to treatment Triage notes, nursing and pharmacy notes and ED provider's notes Notable results as noted in HPI   Assessment and Plan: Generalized  weakness Decreased oral intake No stigmata of infection to explain decreased oral intake.  Continue to monitor oral intake.  Offer nutritional supplements Patient received an IV fluid bolus in the ED. Hydrate IV as needed if not taking orally Consider PT and nutritionist consults  Elevated troponin Troponin 30, no complaints of chest pain and EKG nonacute Continue to trend No prior history of CAD  Hyponatremia Likely hypovolemic Sodium initially 128 improved to 133 with NS bolus Continue to hydrate with NS  Dementia without behavioral disturbance (HCC) Continue donepezil memantine and sertraline  Anemia due to chronic kidney disease Hemoglobin at baseline  Hypothyroidism Continue levothyroxine        DVT prophylaxis: Lovenox  Consults: none  Advance Care Planning:   Code Status: Full Code   Family Communication: Daughter at bedside  Disposition Plan: Back to previous home environment  Severity of Illness: The appropriate patient status for this patient  is OBSERVATION. Observation status is judged to be reasonable and necessary in order to provide the required intensity of service to ensure the patient's safety. The patient's presenting symptoms, physical exam findings, and initial radiographic and laboratory data in the context of their medical condition is felt to place them at decreased risk for further clinical deterioration. Furthermore, it is anticipated that the patient will be medically stable for discharge from the hospital within 2 midnights of admission.   Author: Andris Baumann, MD 05/10/2022 4:26 AM  For on call review [email or web address].

## 2022-05-10 NOTE — Assessment & Plan Note (Signed)
Troponin 30, no complaints of chest pain and EKG nonacute Continue to trend No prior history of CAD

## 2022-05-10 NOTE — ED Provider Notes (Signed)
[facility] Provider Note    Event Date/Time   First MD Initiated Contact with Patient 05/10/22 [phone]     (approximate)   History   Nausea   HPI  Monica Edwards is a 81 y.o. female  history of depression dementia prior encephalopathy stage III chronic kidney disease.  Prior admission reviewed in February patient treated for hypokalemia secondary to vomiting and diarrhea  Patient is here with her daughter who also reports she has healthcare power of attorney.  This morning patient was very fatigued, unresponsive in the point that she is a sort of blankly staring.  Daughter reports this is happened multiple times and what happens if she gets dehydrated her sodium goes low and then she ends up in the hospital and gets better with IV fluids  Her mother did not complain of any abdominal pain no fevers or other concerns except she has chronic poor oral intake.  After receiving a liter of fluid prior to my evaluation, the daughter reports that her mother is starting to look better more responsive conversant and seems to be back to her baseline.  No falls or injuries      Physical Exam   Triage Vital Signs: ED Triage Vitals  Enc Vitals Group     BP 05/09/22 1958 (!) 149/72     Pulse Rate 05/09/22 1955 (!) 54     Resp 05/09/22 1955 18     Temp 05/09/22 1955 98.2 F (36.8 C)     Temp Source 05/09/22 1955 Oral     SpO2 05/09/22 1955 98 %     Weight 05/09/22 1957 130 lb (59 kg)     Height 05/09/22 1957 5\' 1"  (1.549 m)     Head Circumference --      Peak Flow --      Pain Score 05/09/22 1958 0     Pain Loc --      Pain Edu? --      Excl. in GC? --     Most recent vital signs: Vitals:   05/10/22 0152 05/10/22 0557  BP: (!) 129/46 136/61  Pulse: (!) 53 (!) 58  Resp: 16 18  Temp: 97.6 F (36.4 C) 98 F (36.7 C)  SpO2: 100% 97%     General: Awake, no distress.  Alert to self recognizes daughter.  Appears generally fatigued in no acute  distress CV:  Good peripheral perfusion.  Normal tones and rate Resp:  Normal effort.  Clear bilaterally no increased work of breathing Abd:  No distention.  Soft nontender nondistended throughout.  Very careful examination there is absolutely no abdominal tenderness.  Normal bowel sounds Other:  Trace bilateral ankle edema   ED Results / Procedures / Treatments   Labs (all labs ordered are listed, but only abnormal results are displayed) Labs Reviewed  COMPREHENSIVE METABOLIC PANEL - Abnormal; Notable for the following components:      Result Value   Sodium 128 (*)    Glucose, Bld 110 (*)    Creatinine, Ser 1.15 (*)    Calcium 8.0 (*)    Total Protein 6.4 (*)    GFR, Estimated 48 (*)    All other components within normal limits  URINALYSIS, ROUTINE W REFLEX MICROSCOPIC - Abnormal; Notable for the following components:   Color, Urine YELLOW (*)    APPearance HAZY (*)    Specific Gravity, Urine 1.003 (*)    All other components within normal limits  BRAIN NATRIURETIC PEPTIDE -  Abnormal; Notable for the following components:   B Natriuretic Peptide 215.6 (*)    All other components within normal limits  CBC - Abnormal; Notable for the following components:   RBC 3.81 (*)    Hemoglobin 11.4 (*)    HCT 34.3 (*)    All other components within normal limits  BASIC METABOLIC PANEL - Abnormal; Notable for the following components:   Sodium 133 (*)    Glucose, Bld 104 (*)    Creatinine, Ser 1.26 (*)    Calcium 8.5 (*)    GFR, Estimated 43 (*)    All other components within normal limits  TROPONIN I (HIGH SENSITIVITY) - Abnormal; Notable for the following components:   Troponin I (High Sensitivity) 30 (*)    All other components within normal limits  TROPONIN I (HIGH SENSITIVITY) - Abnormal; Notable for the following components:   Troponin I (High Sensitivity) 30 (*)    All other components within normal limits  LIPASE, BLOOD     EKG  Interpreted by me at 2030 heart rate 55  QRS 70 QTc 480 normal sinus rhythm no evidence of acute ischemia or ectopy.   RADIOLOGY   Abdominal plain film interpreted by me as negative for acute obstructive findings  PROCEDURES:  Critical Care performed: No  Procedures   MEDICATIONS ORDERED IN ED: Medications  donepezil (ARICEPT) tablet 5 mg (has no administration in time range)  memantine (NAMENDA XR) 24 hr capsule 28 mg (has no administration in time range)  sertraline (ZOLOFT) tablet 50 mg (has no administration in time range)  levothyroxine (SYNTHROID) tablet 125 mcg (has no administration in time range)  vitamin B-12 (CYANOCOBALAMIN) tablet 1,000 mcg (has no administration in time range)  Vitamin D CAPS 2,000 Units (has no administration in time range)  enoxaparin (LOVENOX) injection 40 mg (has no administration in time range)  acetaminophen (TYLENOL) tablet 650 mg (has no administration in time range)    Or  acetaminophen (TYLENOL) suppository 650 mg (has no administration in time range)  ondansetron (ZOFRAN) tablet 4 mg (has no administration in time range)    Or  ondansetron (ZOFRAN) injection 4 mg (has no administration in time range)  0.9 %  sodium chloride infusion (has no administration in time range)  sodium chloride 0.9 % bolus 1,000 mL (0 mLs Intravenous Stopped 05/10/22 0602)  ondansetron (ZOFRAN) injection 4 mg (4 mg Intravenous Given 05/09/22 2035)     IMPRESSION / MDM / ASSESSMENT AND PLAN / ED COURSE  I reviewed the triage vital signs and the nursing notes.                              Differential diagnosis includes, but is not limited to, possibly dehydration secondary to poor oral intake with a history of similar presentation in the past.  Also considerations over obstruction infection, nausea due to electrolyte abnormalities etc.  No acute neurologic symptoms other than earlier she was sort of blankly staring, but after receiving fluids her mental status has returned to her baseline.  She moves  all extremities well she is very pleasant no headache.  No cardiopulmonary symptoms.  We will obtain abdominal 2 view to exclude obstruction though seems very low pretest probability   Patient's presentation is most consistent with acute illness / injury with system symptoms.  Labs are notable for normal urinalysis, very minimally elevated troponin, hyponatremia sodium 128 which I suspect is a driving factor  in her reported earlier change in mental status and also dehydration is the likely cause.  We will repeat metabolic panel after receiving IV fluid here   Cbc mild anemia.  BNP slightly elevated but no respiratory symptoms, normal oxygen saturation on room air, no clinical findings suggestive of CHF at this time aside from trace lower extremity edema   The patient is on the cardiac monitor to evaluate for evidence of arrhythmia and/or significant heart rate changes.  ----------------------------------------- 3:58 AM on 05/10/2022 ----------------------------------------- Discussed with patient's daughter, and her dehydration, fatigue, and preceding confusion that has now improved we will plan to admit her for observation and ongoing correction of her sodium.  Sodium has risen to 133, at this time but creatinine has also slightly increased to 1.26.  Given the patient had change in mental status earlier that has now resolved and a history of similar presentations multiple times for dehydration according to the daughter, I think overnight observation and treatment of the hospitalist service to be quite reasonable  Case and care consulted with Dr. Drue Novel the hospitalist service who is excepting of admission     FINAL CLINICAL IMPRESSION(S) / ED DIAGNOSES   Final diagnoses:  Hyponatremia  Dehydration     Rx / DC Orders   ED Discharge Orders     None        Note:  This document was prepared using Dragon voice recognition software and [employer] include unintentional dictation  errors.   Sharyn Creamer, MD 05/10/22 [phone]

## 2022-05-10 NOTE — Assessment & Plan Note (Signed)
Continue donepezil memantine and sertraline

## 2022-05-10 NOTE — Assessment & Plan Note (Signed)
Continue levothyroxine 

## 2022-05-10 NOTE — Assessment & Plan Note (Signed)
Likely hypovolemic Sodium initially 128 improved to 133 with NS bolus Continue to hydrate with NS

## 2022-05-10 NOTE — Assessment & Plan Note (Signed)
See above. 

## 2022-06-02 DIAGNOSIS — F03918 Unspecified dementia, unspecified severity, with other behavioral disturbance: Secondary | ICD-10-CM | POA: Diagnosis not present

## 2022-06-02 DIAGNOSIS — R110 Nausea: Secondary | ICD-10-CM | POA: Diagnosis not present

## 2022-06-02 DIAGNOSIS — R197 Diarrhea, unspecified: Secondary | ICD-10-CM | POA: Diagnosis not present

## 2022-06-02 DIAGNOSIS — R634 Abnormal weight loss: Secondary | ICD-10-CM | POA: Diagnosis not present

## 2022-09-15 DIAGNOSIS — Z79899 Other long term (current) drug therapy: Secondary | ICD-10-CM | POA: Diagnosis not present

## 2022-09-15 DIAGNOSIS — R110 Nausea: Secondary | ICD-10-CM | POA: Diagnosis not present

## 2022-09-15 DIAGNOSIS — Z0000 Encounter for general adult medical examination without abnormal findings: Secondary | ICD-10-CM | POA: Diagnosis not present

## 2022-09-15 DIAGNOSIS — E7800 Pure hypercholesterolemia, unspecified: Secondary | ICD-10-CM | POA: Diagnosis not present

## 2022-09-15 DIAGNOSIS — R197 Diarrhea, unspecified: Secondary | ICD-10-CM | POA: Diagnosis not present

## 2022-09-15 DIAGNOSIS — R634 Abnormal weight loss: Secondary | ICD-10-CM | POA: Diagnosis not present

## 2022-09-15 DIAGNOSIS — E876 Hypokalemia: Secondary | ICD-10-CM | POA: Diagnosis not present

## 2022-09-15 DIAGNOSIS — E039 Hypothyroidism, unspecified: Secondary | ICD-10-CM | POA: Diagnosis not present

## 2022-09-15 DIAGNOSIS — Z23 Encounter for immunization: Secondary | ICD-10-CM | POA: Diagnosis not present

## 2022-09-15 DIAGNOSIS — E559 Vitamin D deficiency, unspecified: Secondary | ICD-10-CM | POA: Diagnosis not present

## 2022-09-15 DIAGNOSIS — M545 Low back pain, unspecified: Secondary | ICD-10-CM | POA: Diagnosis not present

## 2022-09-15 DIAGNOSIS — F03918 Unspecified dementia, unspecified severity, with other behavioral disturbance: Secondary | ICD-10-CM | POA: Diagnosis not present

## 2023-03-18 DIAGNOSIS — E876 Hypokalemia: Secondary | ICD-10-CM | POA: Diagnosis not present

## 2023-03-18 DIAGNOSIS — E039 Hypothyroidism, unspecified: Secondary | ICD-10-CM | POA: Diagnosis not present

## 2023-03-18 DIAGNOSIS — M545 Low back pain, unspecified: Secondary | ICD-10-CM | POA: Diagnosis not present

## 2023-03-18 DIAGNOSIS — E7800 Pure hypercholesterolemia, unspecified: Secondary | ICD-10-CM | POA: Diagnosis not present

## 2023-03-18 DIAGNOSIS — N1831 Chronic kidney disease, stage 3a: Secondary | ICD-10-CM | POA: Diagnosis not present

## 2023-03-18 DIAGNOSIS — R197 Diarrhea, unspecified: Secondary | ICD-10-CM | POA: Diagnosis not present

## 2023-03-18 DIAGNOSIS — F02818 Dementia in other diseases classified elsewhere, unspecified severity, with other behavioral disturbance: Secondary | ICD-10-CM | POA: Diagnosis not present

## 2023-03-18 DIAGNOSIS — Z79899 Other long term (current) drug therapy: Secondary | ICD-10-CM | POA: Diagnosis not present

## 2023-03-18 DIAGNOSIS — R634 Abnormal weight loss: Secondary | ICD-10-CM | POA: Diagnosis not present

## 2023-05-30 DIAGNOSIS — D492 Neoplasm of unspecified behavior of bone, soft tissue, and skin: Secondary | ICD-10-CM | POA: Diagnosis not present

## 2023-05-30 DIAGNOSIS — C44619 Basal cell carcinoma of skin of left upper limb, including shoulder: Secondary | ICD-10-CM | POA: Diagnosis not present

## 2023-05-30 DIAGNOSIS — D2262 Melanocytic nevi of left upper limb, including shoulder: Secondary | ICD-10-CM | POA: Diagnosis not present

## 2023-06-15 DIAGNOSIS — C44619 Basal cell carcinoma of skin of left upper limb, including shoulder: Secondary | ICD-10-CM | POA: Diagnosis not present

## 2023-06-16 DIAGNOSIS — C44619 Basal cell carcinoma of skin of left upper limb, including shoulder: Secondary | ICD-10-CM | POA: Diagnosis not present

## 2023-06-16 DIAGNOSIS — L989 Disorder of the skin and subcutaneous tissue, unspecified: Secondary | ICD-10-CM | POA: Diagnosis not present

## 2023-09-21 DIAGNOSIS — M79645 Pain in left finger(s): Secondary | ICD-10-CM | POA: Diagnosis not present

## 2023-09-21 DIAGNOSIS — Z23 Encounter for immunization: Secondary | ICD-10-CM | POA: Diagnosis not present

## 2023-09-21 DIAGNOSIS — Z79899 Other long term (current) drug therapy: Secondary | ICD-10-CM | POA: Diagnosis not present

## 2023-09-21 DIAGNOSIS — Z0000 Encounter for general adult medical examination without abnormal findings: Secondary | ICD-10-CM | POA: Diagnosis not present

## 2023-09-21 DIAGNOSIS — E039 Hypothyroidism, unspecified: Secondary | ICD-10-CM | POA: Diagnosis not present

## 2023-09-21 DIAGNOSIS — M545 Low back pain, unspecified: Secondary | ICD-10-CM | POA: Diagnosis not present

## 2023-09-21 DIAGNOSIS — E7800 Pure hypercholesterolemia, unspecified: Secondary | ICD-10-CM | POA: Diagnosis not present

## 2023-09-21 DIAGNOSIS — N1831 Chronic kidney disease, stage 3a: Secondary | ICD-10-CM | POA: Diagnosis not present

## 2023-09-21 DIAGNOSIS — K529 Noninfective gastroenteritis and colitis, unspecified: Secondary | ICD-10-CM | POA: Diagnosis not present

## 2023-09-21 DIAGNOSIS — H6123 Impacted cerumen, bilateral: Secondary | ICD-10-CM | POA: Diagnosis not present

## 2023-09-21 DIAGNOSIS — E559 Vitamin D deficiency, unspecified: Secondary | ICD-10-CM | POA: Diagnosis not present

## 2023-09-30 DIAGNOSIS — E039 Hypothyroidism, unspecified: Secondary | ICD-10-CM | POA: Diagnosis not present

## 2023-10-06 DIAGNOSIS — K529 Noninfective gastroenteritis and colitis, unspecified: Secondary | ICD-10-CM | POA: Diagnosis not present

## 2023-11-23 DIAGNOSIS — E039 Hypothyroidism, unspecified: Secondary | ICD-10-CM | POA: Diagnosis not present

## 2024-01-11 DIAGNOSIS — E039 Hypothyroidism, unspecified: Secondary | ICD-10-CM | POA: Diagnosis not present

## 2024-02-20 ENCOUNTER — Non-Acute Institutional Stay: Payer: Self-pay | Admitting: Student

## 2024-02-20 ENCOUNTER — Encounter: Payer: Self-pay | Admitting: Student

## 2024-02-20 DIAGNOSIS — K219 Gastro-esophageal reflux disease without esophagitis: Secondary | ICD-10-CM | POA: Diagnosis not present

## 2024-02-20 DIAGNOSIS — Z8679 Personal history of other diseases of the circulatory system: Secondary | ICD-10-CM

## 2024-02-20 DIAGNOSIS — F03B0 Unspecified dementia, moderate, without behavioral disturbance, psychotic disturbance, mood disturbance, and anxiety: Secondary | ICD-10-CM

## 2024-02-20 DIAGNOSIS — E039 Hypothyroidism, unspecified: Secondary | ICD-10-CM

## 2024-02-20 DIAGNOSIS — D631 Anemia in chronic kidney disease: Secondary | ICD-10-CM

## 2024-02-20 DIAGNOSIS — N1831 Chronic kidney disease, stage 3a: Secondary | ICD-10-CM | POA: Diagnosis not present

## 2024-02-20 DIAGNOSIS — M810 Age-related osteoporosis without current pathological fracture: Secondary | ICD-10-CM

## 2024-02-20 NOTE — Progress Notes (Signed)
 Location:   [facility] Room Number: 110-B Place of Service:  ALF [phone]) Provider:  Dr.Senia Even  PCP: Berta Brittle, MD (Inactive)  Patient Care Team: Berta Brittle, MD (Inactive) as PCP - General (Internal Medicine)  Extended Emergency Contact Information Primary Emergency Contact: [facility] Address: [address]          [place], [place] [address] [place] Home Phone: [phone] Work Phone: [phone] Mobile Phone: [phone] Relation: Daughter Secondary Emergency Contact: Nesbitt,Terry Address: [address]          [place], [place] [address] [place] Phone: [phone] Relation: Son  Code Status:  FULL CODE Goals of care: Advanced Directive information    02/20/2024    1:49 PM  Advanced Directives  Does Patient Have a Medical Advance Directive? Yes  Type of Advance Directive Living will;Healthcare Power of Attorney  Does patient want to make changes to medical advance directive? No - Patient declined  Copy of Healthcare Power of Attorney in Chart? No - copy requested     Chief Complaint  Patient presents with   New Admit To SNF    HPI:  Pt is a 83 y.o. female seen today for an acute visit for admission to Memory Care. Patient is from the community and has a history of dementia. She is awake and alert and is in great spirits. She states she is ready to get out of bed at this time  Her daughter wrote a helpful letter outlining patient's likes, dislikes, and functional status at home. She would like care to transfer to in house previsions. She does like to make sure what she has is not from someone else. She lived with family prior to this admission.  Nursing without updates at this time is that she enjoys conversation.    Past Medical History:  Diagnosis Date   Allergy    Dementia (HCC)    Depression    Osteoporosis 2006   Thyroid  disease    Past Surgical History:  Procedure Laterality  Date   brain surgery angionoma  1988   CHOLECYSTECTOMY  06/15/2004   spondylosis  08/26/2003   L-S    Allergies  Allergen Reactions   Alendronate Sodium     REACTION: intolerant   Ezetimibe-Simvastatin      REACTION: side effects   Pravastatin Sodium     REACTION: intolerant   Relafen  [Nabumetone ]     Chest pain   Risedronate Sodium     REACTION: intolerant    Allergies as of 02/20/2024       Reactions   Alendronate Sodium    REACTION: intolerant   Ezetimibe-simvastatin     REACTION: side effects   Pravastatin Sodium    REACTION: intolerant   Relafen  [nabumetone ]    Chest pain   Risedronate Sodium    REACTION: intolerant        Medication List        Accurate as of Pendry 5, 2025  1:49 PM. If you have any questions, ask your nurse or doctor.          BENEFIBER PO Take by mouth as needed.   cyanocobalamin  1000 MCG tablet Commonly known as: VITAMIN B12 Take 1,000 mcg by mouth daily.   donepezil  5 MG tablet Commonly known as: ARICEPT  Take 5 mg by mouth at bedtime.   levothyroxine  88 MCG tablet Commonly known as: SYNTHROID  Take 88 mcg by mouth daily before breakfast.   levothyroxine  125  MCG tablet Commonly known as: SYNTHROID  Take 125 mcg by mouth every morning.   memantine  28 MG Cp24 24 hr capsule Commonly known as: NAMENDA  XR Take 28 mg by mouth at bedtime.   Nitroglycerin 0.4 % Oint Place 1 Application rectally every 12 (twelve) hours as needed.   sertraline  50 MG tablet Commonly known as: ZOLOFT  Take 50 mg by mouth daily.   Vitamin D  50 MCG (2000 UT) Caps Take 2,000 Units by mouth daily.        Review of Systems  Immunization History  Administered Date(s) Administered   Influenza Whole 07/18/2006, 10/24/2007, 07/30/2008, 08/13/2009, 07/28/2010   Influenza-Unspecified 08/07/2014   Pneumococcal Polysaccharide-23 08/13/2009   Td 11/29/2006   Zoster, Live 12/08/2012   Pertinent  Health Maintenance Due  Topic Date Due   DEXA SCAN   Never done   MAMMOGRAM  11/28/2015   INFLUENZA VACCINE  05/18/2024      07/23/2021   11:08 AM 12/07/2021    5:17 PM 12/07/2021    9:30 PM 12/08/2021    9:00 AM 05/09/2022    8:03 PM  Fall Risk  (RETIRED) Patient Fall Risk Level Low fall risk Low fall risk Moderate fall risk Moderate fall risk Moderate fall risk   Functional Status Survey:    Vitals:   02/20/24 1345  BP: (!) 109/56  Pulse: 65  Resp: 16  Temp: (!) 96.2 F (35.7 C)  Height: 5\' 1"  (1.549 m)   Body mass index is 24.56 kg/m. Physical Exam Constitutional:      Appearance: Normal appearance.  Cardiovascular:     Rate and Rhythm: Normal rate and regular rhythm.     Pulses: Normal pulses.     Heart sounds: Normal heart sounds.  Pulmonary:     Effort: Pulmonary effort is normal.  Abdominal:     General: Abdomen is flat. Bowel sounds are normal.     Palpations: Abdomen is soft.  Musculoskeletal:        General: No swelling or tenderness.  Skin:    General: Skin is warm and dry.     Capillary Refill: Capillary refill takes 2 to 3 seconds.  Neurological:     Mental Status: She is alert.     Gait: Gait normal.     Comments: Oriented to self  Psychiatric:        Mood and Affect: Mood normal.     Labs reviewed: No results for input(s): "NA", "K", "CL", "CO2", "GLUCOSE", "BUN", "CREATININE", "CALCIUM", "MG", "PHOS" in the last 8760 hours. No results for input(s): "AST", "ALT", "ALKPHOS", "BILITOT", "PROT", "ALBUMIN" in the last 8760 hours. No results for input(s): "WBC", "NEUTROABS", "HGB", "HCT", "MCV", "PLT" in the last 8760 hours. Lab Results  Component Value Date   TSH 0.04 (L) 01/22/2015   No results found for: "HGBA1C" Lab Results  Component Value Date   CHOL 308 (H) 12/17/2013   HDL 33.80 (L) 12/17/2013   LDLCALC 218 (H) 12/17/2013   LDLDIRECT 127.6 03/17/2011   TRIG 283.0 (H) 12/17/2013   CHOLHDL 9 12/17/2013    Significant Diagnostic Results in last 30 days:  No results  found.  Assessment/Plan Chronic kidney disease, stage 3a (HCC), Chronic  Moderate dementia without behavioral disturbance, psychotic disturbance, mood disturbance, or anxiety, unspecified dementia type (HCC), Chronic  Acute gastroenteritis  Hypothyroidism, unspecified type  Gastroesophageal reflux disease without esophagitis  Age-related osteoporosis without current pathological fracture  Anemia due to stage 3a chronic kidney disease (HCC)  MITRAL VALVE PROLAPSE, HX OF  Patient newly admitted to [facility]. She is doing well so far with the transition. Typically takes 3-4 months to see how well she will do. She is continent of bowel and bladder, but wears briefs in preparation for incontinence. Patient is currently taking aricept  and memantine  XR for dementia. Hypothyroidism well-controlled. Continue vitamin D  supplementation for hx of osteoporosis. Continue zoloft  for mood. Will need to communicate further with family regarding goals of care.   Family/ staff Communication: Nursing  Labs/tests ordered:  CBC, BMP, TSH

## 2024-03-01 ENCOUNTER — Encounter: Payer: Self-pay | Admitting: Student

## 2024-03-01 DIAGNOSIS — Z0189 Encounter for other specified special examinations: Secondary | ICD-10-CM | POA: Diagnosis not present

## 2024-03-01 DIAGNOSIS — E039 Hypothyroidism, unspecified: Secondary | ICD-10-CM | POA: Diagnosis not present

## 2024-03-01 LAB — COMPREHENSIVE METABOLIC PANEL WITH GFR
Calcium: 8.7 (ref 8.7–10.7)
eGFR: 37

## 2024-03-01 LAB — CBC: RBC: 3.48 — AB (ref 3.87–5.11)

## 2024-03-01 LAB — BASIC METABOLIC PANEL WITH GFR
BUN: 30 — AB (ref 4–21)
CO2: 21 (ref 13–22)
Chloride: 110 — AB (ref 99–108)
Creatinine: 1.4 — AB (ref 0.5–1.1)
Glucose: 80
Potassium: 4.3 meq/L (ref 3.5–5.1)
Sodium: 140 (ref 137–147)

## 2024-03-01 LAB — CBC AND DIFFERENTIAL
HCT: 33 — AB (ref 36–46)
Hemoglobin: 10.5 — AB (ref 12.0–16.0)
Neutrophils Absolute: 2470
Platelets: 163 K/uL (ref 150–400)
WBC: 4.9

## 2024-03-01 LAB — TSH: TSH: 5.54 (ref 0.41–5.90)

## 2024-03-22 ENCOUNTER — Encounter: Payer: Self-pay | Admitting: Adult Health

## 2024-03-22 ENCOUNTER — Non-Acute Institutional Stay: Payer: Self-pay | Admitting: Adult Health

## 2024-03-22 DIAGNOSIS — F339 Major depressive disorder, recurrent, unspecified: Secondary | ICD-10-CM | POA: Diagnosis not present

## 2024-03-22 DIAGNOSIS — M150 Primary generalized (osteo)arthritis: Secondary | ICD-10-CM

## 2024-03-22 DIAGNOSIS — F03B0 Unspecified dementia, moderate, without behavioral disturbance, psychotic disturbance, mood disturbance, and anxiety: Secondary | ICD-10-CM

## 2024-03-22 DIAGNOSIS — E039 Hypothyroidism, unspecified: Secondary | ICD-10-CM

## 2024-03-22 NOTE — Progress Notes (Signed)
 Location:  Other (Memory Care) Nursing Home Room Number: 110B Place of Service:  ALF (13) Provider:  Medina-Vargas, Mellody Masri, DNP, FNP-BC  Patient Care Team: Valrie Gehrig, MD as PCP - General [facility] Medicine)  Extended Emergency Contact Information Primary Emergency Contact: [facility] Address: [address]          [place], [place] [address] [place] Home Phone: [phone] Work Phone: [phone] Mobile Phone: [phone] Relation: Daughter Secondary Emergency Contact: Solecki,Terry Address: [address]          [place], [place] [address] [place] Phone: [phone] Relation: Son  Code Status:  DNR  Goals of care: Advanced Directive information    03/22/2024    1:33 PM  Advanced Directives  Does Patient Have a Medical Advance Directive? Yes  Type of Advance Directive Living will;Healthcare Power of [place];Out of facility DNR (pink MOST or yellow form)  Does patient want to make changes to medical advance directive? No - Patient declined  Copy of Healthcare Power of Attorney in Chart? Yes - validated most recent copy scanned in chart (See row information)  Pre-existing out of facility DNR order (yellow form or pink MOST form) Physician notified to receive inpatient order     Chief Complaint  Patient presents with   Acute Visit    Medication management    HPI:  Pt is a 83 y.o. female seen today for an acute regarding medication management.  She is a resident of [facility].  She has osteoarthritis involving multiple joints for which she takes diclofenac 1% gel.  However, no set dosage was set for diclofenac 1% gel.  She takes levothyroxine  88 mcg daily for hypothyroidism.  She takes sertraline  50 mg daily for depression.  Past Medical History:  Diagnosis Date   Allergy    Dementia (HCC)    Depression    Osteoporosis 2006   Thyroid  disease    Past Surgical History:  Procedure Laterality  Date   brain surgery angionoma  1988   CHOLECYSTECTOMY  06/15/2004   spondylosis  08/26/2003   L-S    Allergies  Allergen Reactions   Alendronate Sodium     REACTION: intolerant   Ezetimibe-Simvastatin      REACTION: side effects   Pravastatin Sodium     REACTION: intolerant   Relafen  [Nabumetone ]     Chest pain   Risedronate Sodium     REACTION: intolerant    Outpatient Encounter Medications as of 03/22/2024  Medication Sig   acetaminophen  (TYLENOL ) 325 MG tablet Take 650 mg by mouth every 8 (eight) hours as needed for mild pain (pain score 1-3) or moderate pain (pain score 4-6).   bismuth subsalicylate (PEPTO BISMOL) 262 MG/15ML suspension Take 30 mLs by mouth every 4 (four) hours as needed for diarrhea or loose stools.   diclofenac Sodium (VOLTAREN) 1 % GEL Apply topically every 8 (eight) hours as needed (for pain).   levothyroxine  (SYNTHROID ) 88 MCG tablet Take 88 mcg by mouth daily before breakfast.   melatonin 1 MG TABS tablet Take 1 mg by mouth at bedtime.   Nitroglycerin 0.4 % OINT Place 1 Application rectally every 12 (twelve) hours as needed.   polyethylene glycol (MIRALAX / GLYCOLAX) 17 g packet Take 17 g by mouth daily.   sertraline  (ZOLOFT ) 50 MG tablet Take 50 mg by mouth daily.   Wheat Dextrin (BENEFIBER PO) Take by mouth as needed.   Cholecalciferol (VITAMIN D ) 50 MCG (2000  UT) CAPS Take 2,000 Units by mouth daily. (Patient not taking: Reported on 02/20/2024)   donepezil  (ARICEPT ) 5 MG tablet Take 5 mg by mouth at bedtime. (Patient not taking: Reported on 02/20/2024)   levothyroxine  (SYNTHROID ) 125 MCG tablet Take 125 mcg by mouth every morning. (Patient not taking: Reported on 02/20/2024)   memantine  (NAMENDA  XR) 28 MG CP24 24 hr capsule Take 28 mg by mouth at bedtime. (Patient not taking: Reported on 02/20/2024)   vitamin B-12 (CYANOCOBALAMIN ) 1000 MCG tablet Take 1,000 mcg by mouth daily. (Patient not taking: Reported on 02/20/2024)   No facility-administered encounter  medications on file as of 03/22/2024.    Review of Systems  Unable to obtain due to dementia.    Immunization History  Administered Date(s) Administered   Influenza Whole 07/18/2006, 10/24/2007, 07/30/2008, 08/13/2009, 07/28/2010   Influenza-Unspecified 08/07/2014   Pneumococcal Polysaccharide-23 08/13/2009   Td 11/29/2006   Zoster, Live 12/08/2012   Pertinent  Health Maintenance Due  Topic Date Due   DEXA SCAN  Never done   MAMMOGRAM  11/28/2015   INFLUENZA VACCINE  05/18/2024      07/23/2021   11:08 AM 12/07/2021    5:17 PM 12/07/2021    9:30 PM 12/08/2021    9:00 AM 05/09/2022    8:03 PM  Fall Risk  (RETIRED) Patient Fall Risk Level Low fall risk Low fall risk Moderate fall risk Moderate fall risk Moderate fall risk     Vitals:   03/22/24 1316  BP: 120/63  Pulse: (!) 53  Resp: (!) 21  Temp: 97.7 F (36.5 C)  SpO2: 99%  Weight: 155 lb 9.6 oz (70.6 kg)  Height: 5\' 1"  (1.549 m)   Body mass index is 29.4 kg/m.  Physical Exam Constitutional:      General: She is not in acute distress.    Appearance: Normal appearance.  HENT:     Head: Normocephalic and atraumatic.     Nose: Nose normal.     Mouth/Throat:     Mouth: Mucous membranes are moist.  Eyes:     Conjunctiva/sclera: Conjunctivae normal.  Cardiovascular:     Rate and Rhythm: Normal rate and regular rhythm.  Pulmonary:     Effort: Pulmonary effort is normal.     Breath sounds: Normal breath sounds.  Abdominal:     General: Bowel sounds are normal.     Palpations: Abdomen is soft.  Musculoskeletal:        General: Normal range of motion.     Cervical back: Normal range of motion.  Skin:    General: Skin is warm and dry.  Neurological:     Mental Status: She is alert.  Psychiatric:        Mood and Affect: Mood normal.        Behavior: Behavior normal.       Labs reviewed: No results for input(s): "NA", "K", "CL", "CO2", "GLUCOSE", "BUN", "CREATININE", "CALCIUM", "MG", "PHOS" in the last 8760  hours. No results for input(s): "AST", "ALT", "ALKPHOS", "BILITOT", "PROT", "ALBUMIN" in the last 8760 hours. No results for input(s): "WBC", "NEUTROABS", "HGB", "HCT", "MCV", "PLT" in the last 8760 hours. Lab Results  Component Value Date   TSH 0.04 (L) 01/22/2015   No results found for: "HGBA1C" Lab Results  Component Value Date   CHOL 308 (H) 12/17/2013   HDL 33.80 (L) 12/17/2013   LDLCALC 218 (H) 12/17/2013   LDLDIRECT 127.6 03/17/2011   TRIG 283.0 (H) 12/17/2013   CHOLHDL 9 12/17/2013  Significant Diagnostic Results in last 30 days:  No results found.  Assessment/Plan  1. Primary osteoarthritis involving multiple joints (Primary) -   Will start diclofenac 1% gel 2 g topically to lower back and bilateral wrist every 8 hours as needed.  2. Hypothyroidism, unspecified type Lab Results  Component Value Date   TSH 0.04 (L) 01/22/2015    -TSH 5.54, taken 03/01/2024 -   Continue levothyroxine  88 mcg daily  3. Major depression, recurrent, chronic (HCC) -Mood is stable Continue sertraline  50 mg daily-    4. Moderate dementia without behavioral disturbance, psychotic disturbance, mood disturbance, or anxiety, unspecified dementia type (HCC) -Continue supportive care - Fall precautions       Family/ staff Communication: Discussed plan of care with charge nurse.  Labs/tests ordered:  None    Adylin Hankey Medina-Vargas, DNP, MSN, FNP-BC [facility] and Adult Medicine [phone] (Monday-Friday 8:00 a.m. - 5:00 p.m.) [phone] (after hours)

## 2024-04-05 DIAGNOSIS — D649 Anemia, unspecified: Secondary | ICD-10-CM | POA: Diagnosis not present

## 2024-04-05 LAB — IRON,TIBC AND FERRITIN PANEL
%SAT: 12
Ferritin: 26
Iron: 46
TIBC: 375

## 2024-04-05 LAB — VITAMIN B12: Vitamin B-12: 298

## 2024-04-05 LAB — CBC: RBC: 3.8 — AB (ref 3.87–5.11)

## 2024-04-05 LAB — COMPREHENSIVE METABOLIC PANEL WITH GFR
Calcium: 8.7 (ref 8.7–10.7)
eGFR: 34

## 2024-04-05 LAB — BASIC METABOLIC PANEL WITH GFR
BUN: 29 — AB (ref 4–21)
CO2: 23 — AB (ref 13–22)
Chloride: 109 — AB (ref 99–108)
Creatinine: 1.5 — AB (ref 0.5–1.1)
Glucose: 83
Potassium: 4.2 meq/L (ref 3.5–5.1)
Sodium: 141 (ref 137–147)

## 2024-04-05 LAB — CBC AND DIFFERENTIAL
HCT: 37 (ref 36–46)
Hemoglobin: 11.6 — AB (ref 12.0–16.0)
Neutrophils Absolute: 2271
Platelets: 161 K/uL (ref 150–400)
WBC: 4.1

## 2024-04-05 LAB — VITAMIN D 25 HYDROXY (VIT D DEFICIENCY, FRACTURES): Vit D, 25-Hydroxy: 31

## 2024-05-29 ENCOUNTER — Encounter: Payer: Self-pay | Admitting: Nurse Practitioner

## 2024-05-29 ENCOUNTER — Non-Acute Institutional Stay: Admitting: Nurse Practitioner

## 2024-05-29 DIAGNOSIS — E039 Hypothyroidism, unspecified: Secondary | ICD-10-CM | POA: Diagnosis not present

## 2024-05-29 DIAGNOSIS — K219 Gastro-esophageal reflux disease without esophagitis: Secondary | ICD-10-CM

## 2024-05-29 DIAGNOSIS — F339 Major depressive disorder, recurrent, unspecified: Secondary | ICD-10-CM | POA: Diagnosis not present

## 2024-05-29 DIAGNOSIS — D509 Iron deficiency anemia, unspecified: Secondary | ICD-10-CM

## 2024-05-29 DIAGNOSIS — M150 Primary generalized (osteo)arthritis: Secondary | ICD-10-CM | POA: Insufficient documentation

## 2024-05-29 DIAGNOSIS — N1831 Chronic kidney disease, stage 3a: Secondary | ICD-10-CM | POA: Diagnosis not present

## 2024-05-29 DIAGNOSIS — F03B0 Unspecified dementia, moderate, without behavioral disturbance, psychotic disturbance, mood disturbance, and anxiety: Secondary | ICD-10-CM | POA: Diagnosis not present

## 2024-05-29 DIAGNOSIS — E538 Deficiency of other specified B group vitamins: Secondary | ICD-10-CM | POA: Diagnosis not present

## 2024-05-29 NOTE — Progress Notes (Signed)
 Location:  Other [facility].  Nursing Home Room Number: [facility] [id number] Place of Service:  ALF [phone]) Harlene An, NP  PCP: Abdul Fine, MD  Patient Care Team: Abdul Fine, MD as PCP - General [facility] Medicine)  Extended Emergency Contact Information Primary Emergency Contact: [facility] Address: [address]          [place], [place] [address] [place] Home Phone: [phone] Work Phone: [phone] Mobile Phone: [phone] Relation: Daughter Secondary Emergency Contact: Clay,Terry Address: [address]          [place], [place] [address] [place] Phone: [phone] Relation: Son  Goals of care: Advanced Directive information    05/29/2024   10:51 AM  Advanced Directives  Does Patient Have a Medical Advance Directive? Yes  Type of Advance Directive Out of facility DNR (pink MOST or yellow form)  Does patient want to make changes to medical advance directive? No - Patient declined     Chief Complaint  Patient presents with   Medical Management of Chronic Issues    Medical Management of Chronic Issues.     HPI:  Pt is a 83 y.o. female seen today for medical management of chronic disease. Pt with hx of dementia, OA, hypothyroid, depression who lives in [facility].  She moved into facility in [place] and has been doing well with transition.  Staff reports she sleeps most of the morning but gets up closer lunch and is out in facility there after.  She was recently diagnosised with b12 def and has been started on b12 supplements.  Pt reports she is doing well. States she is happy. Denies any pain Reports she eats well.   Past Medical History:  Diagnosis Date   Allergy    Dementia (HCC)    Depression    Osteoporosis 2006   Thyroid  disease    Past Surgical History:  Procedure Laterality Date   brain surgery angionoma  1988   CHOLECYSTECTOMY  06/15/2004   spondylosis  08/26/2003    L-S    Allergies  Allergen Reactions   Alendronate Sodium     REACTION: intolerant   Ezetimibe-Simvastatin      REACTION: side effects   Pravastatin Sodium     REACTION: intolerant   Relafen  [Nabumetone ]     Chest pain   Risedronate Sodium     REACTION: intolerant    Outpatient Encounter Medications as of 05/29/2024  Medication Sig   acetaminophen  (TYLENOL ) 325 MG tablet Take 650 mg by mouth every 8 (eight) hours as needed for mild pain (pain score 1-3) or moderate pain (pain score 4-6). Take Two tablets by mouth twice daily.   diclofenac Sodium (VOLTAREN) 1 % GEL Apply topically every 8 (eight) hours as needed (for pain).   ferrous sulfate 325 (65 FE) MG EC tablet Take 325 mg by mouth. Daily every Monday, Wednesday and Friday.   hydrocortisone cream 1 % Apply 1 Application topically 4 (four) times daily.   levothyroxine  (SYNTHROID ) 88 MCG tablet Take 88 mcg by mouth daily before breakfast.   melatonin 1 MG TABS tablet Take 1 mg by mouth at bedtime.   polyethylene glycol (MIRALAX / GLYCOLAX) 17 g packet Take 17 g by mouth daily.   sertraline  (ZOLOFT ) 50 MG tablet Take 50 mg by mouth daily.   vitamin B-12 (CYANOCOBALAMIN ) 1000 MCG tablet Take 1,000 mcg by mouth daily.   bismuth subsalicylate (PEPTO BISMOL) 262 MG/15ML suspension Take 30 mLs  by mouth every 4 (four) hours as needed for diarrhea or loose stools. (Patient not taking: Reported on 05/29/2024)   Cholecalciferol (VITAMIN D ) 50 MCG (2000 UT) CAPS Take 2,000 Units by mouth daily. (Patient not taking: Reported on 05/29/2024)   donepezil  (ARICEPT ) 5 MG tablet Take 5 mg by mouth at bedtime. (Patient not taking: Reported on 05/29/2024)   levothyroxine  (SYNTHROID ) 125 MCG tablet Take 125 mcg by mouth every morning. (Patient not taking: Reported on 05/29/2024)   memantine  (NAMENDA  XR) 28 MG CP24 24 hr capsule Take 28 mg by mouth at bedtime. (Patient not taking: Reported on 05/29/2024)   Nitroglycerin 0.4 % OINT Place 1 Application rectally  every 12 (twelve) hours as needed. (Patient not taking: Reported on 05/29/2024)   Wheat Dextrin (BENEFIBER PO) Take by mouth as needed. (Patient not taking: Reported on 05/29/2024)   No facility-administered encounter medications on file as of 05/29/2024.    Review of Systems  Constitutional:  Negative for activity change, appetite change, fatigue and unexpected weight change.  HENT:  Negative for congestion and hearing loss.   Eyes: Negative.   Respiratory:  Negative for cough and shortness of breath.   Cardiovascular:  Negative for chest pain, palpitations and leg swelling.  Gastrointestinal:  Negative for abdominal pain, constipation and diarrhea.  Genitourinary:  Negative for difficulty urinating and dysuria.  Musculoskeletal:  Negative for arthralgias and myalgias.  Skin:  Negative for color change and wound.  Neurological:  Negative for dizziness and weakness.  Psychiatric/Behavioral:  Positive for confusion. Negative for agitation and behavioral problems.      Immunization History  Administered Date(s) Administered   Influenza Whole 07/18/2006, 10/24/2007, 07/30/2008, 08/13/2009, 07/28/2010   Influenza-Unspecified 08/07/2014   Pneumococcal Conjugate-13 07/15/2015   Pneumococcal Polysaccharide-23 08/13/2009, 08/02/2019   Td 11/29/2006   Zoster, Live 12/08/2012   Pertinent  Health Maintenance Due  Topic Date Due   DEXA SCAN  Never done   MAMMOGRAM  11/28/2015   INFLUENZA VACCINE  05/18/2024      07/23/2021   11:08 AM 12/07/2021    5:17 PM 12/07/2021    9:30 PM 12/08/2021    9:00 AM 05/09/2022    8:03 PM  Fall Risk  (RETIRED) Patient Fall Risk Level Low fall risk  Low fall risk  Moderate fall risk  Moderate fall risk  Moderate fall risk      Data saved with a previous flowsheet row definition   Functional Status Survey:    Vitals:   05/29/24 1027  BP: 107/61  Pulse: (!) 57  Resp: 17  Temp: 98.2 F (36.8 C)  SpO2: 98%  Weight: 156 lb 9.6 oz (71 kg)  Height: 5' 1  (1.549 m)   Body mass index is 29.59 kg/m. Wt Readings from Last 3 Encounters:  05/29/24 156 lb 9.6 oz (71 kg)  03/22/24 155 lb 9.6 oz (70.6 kg)  05/09/22 130 lb (59 kg)    Physical Exam Constitutional:      General: She is not in acute distress.    Appearance: She is well-developed. She is not diaphoretic.  HENT:     Head: Normocephalic and atraumatic.     Mouth/Throat:     Pharynx: No oropharyngeal exudate.  Eyes:     Conjunctiva/sclera: Conjunctivae normal.     Pupils: Pupils are equal, round, and reactive to light.  Cardiovascular:     Rate and Rhythm: Normal rate and regular rhythm.     Heart sounds: Normal heart sounds.  Pulmonary:  Effort: Pulmonary effort is normal.     Breath sounds: Normal breath sounds.  Abdominal:     General: Bowel sounds are normal.     Palpations: Abdomen is soft.  Musculoskeletal:     Cervical back: Normal range of motion and neck supple.     Right lower leg: No edema.     Left lower leg: No edema.  Skin:    General: Skin is warm and dry.  Neurological:     Mental Status: She is alert.  Psychiatric:        Mood and Affect: Mood normal.     Labs reviewed: Recent Labs    03/01/24 0000 04/05/24 0000  NA 140 141  K 4.3 4.2  CL 110* 109*  CO2 21 23*  BUN 30* 29*  CREATININE 1.4* 1.5*  CALCIUM 8.7 8.7   No results for input(s): AST, ALT, ALKPHOS, BILITOT, PROT, ALBUMIN in the last 8760 hours. Recent Labs    03/01/24 0000 04/05/24 0000  WBC 4.9 4.1  NEUTROABS 2,470.00 2,271.00  HGB 10.5* 11.6*  HCT 33* 37  PLT 163 161   Lab Results  Component Value Date   TSH 5.54 03/01/2024   No results found for: HGBA1C Lab Results  Component Value Date   CHOL 308 (H) 12/17/2013   HDL 33.80 (L) 12/17/2013   LDLCALC 218 (H) 12/17/2013   LDLDIRECT 127.6 03/17/2011   TRIG 283.0 (H) 12/17/2013   CHOLHDL 9 12/17/2013    Significant Diagnostic Results in last 30 days:  No results found.  Assessment/Plan Vitamin  B12 deficiency Continues on b12 supplement  Primary osteoarthritis involving multiple joints Stable at this time, continues on voltaren gel PRN  Moderate dementia without behavioral disturbance, psychotic disturbance, mood disturbance, or anxiety, unspecified dementia type (HCC) Stable, no acute changes in cognitive or functional status, continue supportive care.    Major depression, recurrent, chronic (HCC) controlled on zoloft  50 mg daily   Hypothyroidism Continues on synthroid  88 mcg, TSH in acceptable range.   GERD Stable at thi stime   Chronic kidney disease, stage 3a (HCC) Chronic and stable Encourage proper hydration Follow metabolic panel Avoid nephrotoxic meds (NSAIDS)   Iron deficiency anemia Continues on iron supplement      Treyshon Buchanon K. Caro BODILY [facility] & Adult Medicine [phone]

## 2024-05-29 NOTE — Assessment & Plan Note (Signed)
 Chronic and stable Encourage proper hydration Follow metabolic panel Avoid nephrotoxic meds (NSAIDS)

## 2024-05-29 NOTE — Assessment & Plan Note (Signed)
 Stable at this time

## 2024-05-29 NOTE — Assessment & Plan Note (Signed)
 Continues on b12 supplement

## 2024-05-29 NOTE — Assessment & Plan Note (Signed)
 Stable at this time, continues on voltaren gel PRN

## 2024-05-29 NOTE — Assessment & Plan Note (Signed)
 Continues on synthroid  88 mcg, TSH in acceptable range.

## 2024-05-29 NOTE — Assessment & Plan Note (Signed)
 Continues on iron supplement

## 2024-05-29 NOTE — Assessment & Plan Note (Signed)
 Stable, no acute changes in cognitive or functional status, continue supportive care.

## 2024-05-29 NOTE — Assessment & Plan Note (Signed)
 controlled on zoloft  50 mg daily

## 2024-07-16 ENCOUNTER — Non-Acute Institutional Stay: Payer: Self-pay | Admitting: Adult Health

## 2024-07-16 ENCOUNTER — Encounter: Payer: Self-pay | Admitting: Adult Health

## 2024-07-16 DIAGNOSIS — F39 Unspecified mood [affective] disorder: Secondary | ICD-10-CM

## 2024-07-16 DIAGNOSIS — F03B18 Unspecified dementia, moderate, with other behavioral disturbance: Secondary | ICD-10-CM

## 2024-07-16 DIAGNOSIS — F339 Major depressive disorder, recurrent, unspecified: Secondary | ICD-10-CM

## 2024-07-16 NOTE — Progress Notes (Signed)
 Location:  Other ([facility]) Nursing Home Room Number: 110 B Place of Service:  ALF [phone]) Provider:  Medina-Vargas, Romanda Turrubiates, DNP, FNP-BC  Patient Care Team: Abdul Fine, MD as PCP - General (Family Medicine)  Extended Emergency Contact Information Primary Emergency Contact: [facility] Address: [address]          [place], [place] [address] [place] Home Phone: [phone] Work Phone: [phone] Mobile Phone: [phone] Relation: Daughter Secondary Emergency Contact: Vivanco,Terry Address: [address]          [place], [place] [address] [place] Phone: [phone] Relation: Son  Code Status:   DNR  Goals of care: Advanced Directive information    05/29/2024   10:51 AM  Advanced Directives  Does Patient Have a Medical Advance Directive? Yes  Type of Advance Directive Out of facility DNR (pink MOST or yellow form)  Does patient want to make changes to medical advance directive? No - Patient declined     Chief Complaint  Patient presents with   Acute Visit    agitation    HPI:  Pt is a 83 y.o. female seen today for an acute visit regarding agitation. She is a resident of [facility]. She was reported to have outburst of agitation, loud outburst of negative statements which disrupts other resident. She was reported to be argumentative with other residents and staff. She is possessive of things and gets angry about them.   She takes Sertraline  50 mg daily for depression.  She takes Levothyroxine  88 mcg daily for hypothyroidism.   Past Medical History:  Diagnosis Date   Allergy    Dementia (HCC)    Depression    Osteoporosis 2006   Thyroid  disease    Past Surgical History:  Procedure Laterality Date   brain surgery angionoma  1988   CHOLECYSTECTOMY  06/15/2004   spondylosis  08/26/2003   L-S    Allergies  Allergen Reactions   Alendronate Sodium     REACTION: intolerant    Ezetimibe-Simvastatin      REACTION: side effects   Pravastatin Sodium     REACTION: intolerant   Relafen  [Nabumetone ]     Chest pain   Risedronate Sodium     REACTION: intolerant    Outpatient Encounter Medications as of 07/16/2024  Medication Sig   acetaminophen  (TYLENOL ) 325 MG tablet Take 650 mg by mouth every 8 (eight) hours as needed for mild pain (pain score 1-3) or moderate pain (pain score 4-6). Take Two tablets by mouth twice daily.   diclofenac Sodium (VOLTAREN) 1 % GEL Apply topically every 8 (eight) hours as needed (for pain).   ferrous sulfate 325 (65 FE) MG EC tablet Take 325 mg by mouth. Daily every Monday, Wednesday and Friday.   hydrocortisone cream 1 % Apply 1 Application topically 4 (four) times daily.   levothyroxine  (SYNTHROID ) 88 MCG tablet Take 88 mcg by mouth daily before breakfast.   melatonin 1 MG TABS tablet Take 1 mg by mouth at bedtime.   Nitroglycerin 0.4 % OINT Place 1 Application rectally every 12 (twelve) hours as needed. (Patient not taking: Reported on 05/29/2024)   polyethylene glycol (MIRALAX / GLYCOLAX) 17 g packet Take 17 g by mouth daily.   sertraline  (ZOLOFT ) 50 MG tablet Take 50 mg by mouth daily.   vitamin B-12 (CYANOCOBALAMIN ) 1000 MCG tablet Take 1,000 mcg by mouth daily.   Wheat Dextrin (BENEFIBER PO) Take by mouth as needed. (  Patient not taking: Reported on 05/29/2024)   No facility-administered encounter medications on file as of 07/16/2024.    Review of Systems  Unable to obtain due to dementia.    Immunization History  Administered Date(s) Administered   Influenza Whole 07/18/2006, 10/24/2007, 07/30/2008, 08/13/2009, 07/28/2010   Influenza-Unspecified 08/07/2014   Pneumococcal Conjugate-13 07/15/2015   Pneumococcal Polysaccharide-23 08/13/2009, 08/02/2019   Td 11/29/2006   Zoster, Live 12/08/2012   Pertinent  Health Maintenance Due  Topic Date Due   DEXA SCAN  Never done   Mammogram  11/28/2015   Influenza Vaccine   05/18/2024      07/23/2021   11:08 AM 12/07/2021    5:17 PM 12/07/2021    9:30 PM 12/08/2021    9:00 AM 05/09/2022    8:03 PM  Fall Risk  (RETIRED) Patient Fall Risk Level Low fall risk  Low fall risk  Moderate fall risk  Moderate fall risk  Moderate fall risk      Data saved with a previous flowsheet row definition     Vitals:   07/16/24 1737  BP: 120/64  Pulse: 71  Resp: 20  Temp: 98.1 F (36.7 C)  SpO2: 92%  Weight: 161 lb 12.8 oz (73.4 kg)  Height: 5' 1 (1.549 m)   Body mass index is 30.57 kg/m.  Physical Exam Constitutional:      General: She is not in acute distress.    Appearance: She is obese.  HENT:     Head: Normocephalic and atraumatic.     Ears:     Comments: Wears bilateral hearing aids.    Nose: Nose normal.     Mouth/Throat:     Mouth: Mucous membranes are moist.  Eyes:     Conjunctiva/sclera: Conjunctivae normal.  Cardiovascular:     Rate and Rhythm: Normal rate and regular rhythm.  Pulmonary:     Effort: Pulmonary effort is normal.     Breath sounds: Normal breath sounds.  Abdominal:     General: Bowel sounds are normal.     Palpations: Abdomen is soft.  Musculoskeletal:        General: Normal range of motion.     Cervical back: Normal range of motion.  Skin:    General: Skin is warm and dry.  Neurological:     Mental Status: She is alert.  Psychiatric:        Mood and Affect: Mood normal.        Behavior: Behavior normal.        Labs reviewed: Recent Labs    03/01/24 0000 04/05/24 0000  NA 140 141  K 4.3 4.2  CL 110* 109*  CO2 21 23*  BUN 30* 29*  CREATININE 1.4* 1.5*  CALCIUM 8.7 8.7   No results for input(s): AST, ALT, ALKPHOS, BILITOT, PROT, ALBUMIN in the last 8760 hours. Recent Labs    03/01/24 0000 04/05/24 0000  WBC 4.9 4.1  NEUTROABS 2,470.00 2,271.00  HGB 10.5* 11.6*  HCT 33* 37  PLT 163 161   Lab Results  Component Value Date   TSH 5.54 03/01/2024   No results found for: HGBA1C Lab  Results  Component Value Date   CHOL 308 (H) 12/17/2013   HDL 33.80 (L) 12/17/2013   LDLCALC 218 (H) 12/17/2013   LDLDIRECT 127.6 03/17/2011   TRIG 283.0 (H) 12/17/2013   CHOLHDL 9 12/17/2013    Significant Diagnostic Results in last 30 days:  No results found.  Assessment/Plan  1. Mood disorder (Primary) -  loud, argumentative and outbursts agitation -  will start with Depakote sprinkle 125 mg 1 capsule PO at 2PM -  monitor behavior  2. Major depression, recurrent, chronic -  likes to do coloring  -  continue Sertraline  50 mg daily  3. Moderate dementia with behavioral disturbance (HCC) -  continue supportive care -  continue care at memory unit -  fall precautions    Family/ staff Communication: Discussed plan of care with charge nurse.  Labs/tests ordered: None    Savalas Monje Medina-Vargas, DNP, MSN, FNP-BC [facility] [phone] (Monday-Friday 8:00 a.m. - 5:00 p.m.) [phone] (after hours)

## 2024-08-02 DIAGNOSIS — H6123 Impacted cerumen, bilateral: Secondary | ICD-10-CM | POA: Diagnosis not present

## 2024-08-02 DIAGNOSIS — H900 Conductive hearing loss, bilateral: Secondary | ICD-10-CM | POA: Diagnosis not present

## 2024-08-27 LAB — HEPATIC FUNCTION PANEL
ALT: 8 U/L (ref 7–35)
AST: 12 — AB (ref 13–35)
Alkaline Phosphatase: 54 (ref 25–125)
Bilirubin, Direct: 0.1 (ref 0.01–0.4)
Bilirubin, Total: 0.5

## 2024-08-27 LAB — COMPREHENSIVE METABOLIC PANEL WITH GFR
Albumin: 4 (ref 3.5–5.0)
Globulin: 2.2

## 2024-08-28 ENCOUNTER — Encounter: Payer: Self-pay | Admitting: Nurse Practitioner

## 2024-08-28 ENCOUNTER — Non-Acute Institutional Stay: Admitting: Nurse Practitioner

## 2024-08-28 DIAGNOSIS — N1831 Chronic kidney disease, stage 3a: Secondary | ICD-10-CM

## 2024-08-28 DIAGNOSIS — M545 Low back pain, unspecified: Secondary | ICD-10-CM

## 2024-08-28 DIAGNOSIS — D509 Iron deficiency anemia, unspecified: Secondary | ICD-10-CM

## 2024-08-28 DIAGNOSIS — M150 Primary generalized (osteo)arthritis: Secondary | ICD-10-CM | POA: Diagnosis not present

## 2024-08-28 DIAGNOSIS — G8929 Other chronic pain: Secondary | ICD-10-CM

## 2024-08-28 DIAGNOSIS — E039 Hypothyroidism, unspecified: Secondary | ICD-10-CM

## 2024-08-28 DIAGNOSIS — F03B18 Unspecified dementia, moderate, with other behavioral disturbance: Secondary | ICD-10-CM

## 2024-08-28 DIAGNOSIS — E538 Deficiency of other specified B group vitamins: Secondary | ICD-10-CM

## 2024-08-28 DIAGNOSIS — F39 Unspecified mood [affective] disorder: Secondary | ICD-10-CM | POA: Diagnosis not present

## 2024-08-28 NOTE — Assessment & Plan Note (Signed)
 Continues on zoloft

## 2024-08-28 NOTE — Assessment & Plan Note (Signed)
 Controlled on scheduled tylenol 

## 2024-08-28 NOTE — Assessment & Plan Note (Signed)
 Continues on b12 supplement

## 2024-08-28 NOTE — Assessment & Plan Note (Signed)
 Chronic and stable Encourage proper hydration Follow metabolic panel Avoid nephrotoxic meds (NSAIDS)

## 2024-08-28 NOTE — Assessment & Plan Note (Signed)
 Continues on synthroid  88 mcg.  TSH stable on last lab

## 2024-08-28 NOTE — Assessment & Plan Note (Signed)
 Continue on iron supplement Follow up cbc

## 2024-08-28 NOTE — Progress Notes (Signed)
 Location:  Other [facility].  Nursing Home Room Number: [facility] [id number] Place of Service:  ALF [phone]) Monica An, NP  PCP: Laurence Locus, DO  Patient Care Team: Laurence Locus, DO as PCP - General (Internal Medicine)  Extended Emergency Contact Information Primary Emergency Contact: [facility] Address: [address]          [place], [place] [address] [place] Home Phone: [phone] Work Phone: [phone] Mobile Phone: [phone] Relation: Daughter Secondary Emergency Contact: Jungwirth,Terry Address: [address]          [place], [place] [address] [place] Phone: [phone] Relation: Son  Goals of care: Advanced Directive information    08/28/2024    9:32 AM  Advanced Directives  Does Patient Have a Medical Advance Directive? Yes  Type of Advance Directive Out of facility DNR (pink MOST or yellow form)  Does patient want to make changes to medical advance directive? No - Patient declined     Chief Complaint  Patient presents with   Medical Management of Chronic Issues    Medical management of Chronic Issues.     HPI:  Pt is a 83 y.o. female seen today for medical management of chronic disease. Pt with hx of dementia, depression, anxiety, OA, hypothyroid.  She was having more agitation and Depakote was added in the afternoon which has helped significantly.  Nursing reports she continues to sleep late in the morning but very active during the day.  She has been eating well.  Weight has been stable No complaints of pain.   Past Medical History:  Diagnosis Date   Allergy    Dementia (HCC)    Depression    Osteoporosis 2006   Thyroid  disease    Past Surgical History:  Procedure Laterality Date   brain surgery angionoma  1988   CHOLECYSTECTOMY  06/15/2004   spondylosis  08/26/2003   L-S    Allergies  Allergen Reactions   Alendronate Sodium     REACTION: intolerant   Ezetimibe-Simvastatin      REACTION:  side effects   Pravastatin Sodium     REACTION: intolerant   Relafen  [Nabumetone ]     Chest pain   Risedronate Sodium     REACTION: intolerant    Outpatient Encounter Medications as of 08/28/2024  Medication Sig   acetaminophen  (TYLENOL ) 325 MG tablet Take 650 mg by mouth every 8 (eight) hours as needed for mild pain (pain score 1-3) or moderate pain (pain score 4-6). Take Two tablets by mouth twice daily.   alum & mag hydroxide-simeth (ANTACID) 200-200-20 MG/5ML suspension Take 30 mLs by mouth every 4 (four) hours as needed for indigestion or heartburn.   diclofenac Sodium (VOLTAREN) 1 % GEL Apply topically every 8 (eight) hours as needed (for pain).   divalproex (DEPAKOTE SPRINKLE) 125 MG capsule Take 125 mg by mouth daily.   ferrous sulfate 325 (65 FE) MG EC tablet Take 325 mg by mouth. Daily every Monday, Wednesday and Friday.   guaiFENesin (ROBITUSSIN) 100 MG/5ML liquid Take 10 mLs by mouth every 4 (four) hours as needed for cough or to loosen phlegm.   hydrocortisone cream 1 % Apply 1 Application topically 4 (four) times daily.   levothyroxine  (SYNTHROID ) 88 MCG tablet Take 88 mcg by mouth daily before breakfast.   melatonin 1 MG TABS tablet Take 1 mg by mouth at bedtime.   polyethylene glycol (MIRALAX / GLYCOLAX) 17 g packet Take 17 g by mouth daily.  sertraline  (ZOLOFT ) 50 MG tablet Take 50 mg by mouth daily.   vitamin B-12 (CYANOCOBALAMIN ) 1000 MCG tablet Take 1,000 mcg by mouth daily.   Nitroglycerin 0.4 % OINT Place 1 Application rectally every 12 (twelve) hours as needed. (Patient not taking: Reported on 08/28/2024)   Wheat Dextrin (BENEFIBER PO) Take by mouth as needed. (Patient not taking: Reported on 08/28/2024)   No facility-administered encounter medications on file as of 08/28/2024.    Review of Systems  Constitutional:  Negative for chills and fever.  HENT:  Negative for tinnitus.   Respiratory:  Negative for cough and shortness of breath.   Cardiovascular:   Negative for chest pain, palpitations and leg swelling.  Gastrointestinal:  Negative for abdominal pain, constipation and diarrhea.  Genitourinary:  Negative for dysuria, frequency and urgency.  Musculoskeletal:  Negative for back pain and myalgias.  Skin: Negative.   Neurological:  Negative for dizziness and headaches.  Psychiatric/Behavioral:  Positive for confusion.      Immunization History  Administered Date(s) Administered   Influenza Whole 07/18/2006, 10/24/2007, 07/30/2008, 08/13/2009, 07/28/2010   Influenza-Unspecified 08/07/2014, 07/31/2024   Pneumococcal Conjugate-13 07/15/2015   Pneumococcal Polysaccharide-23 08/13/2009, 08/02/2019   Td 11/29/2006   Zoster, Live 12/08/2012   Pertinent  Health Maintenance Due  Topic Date Due   DEXA SCAN  Never done   Mammogram  11/28/2015   Influenza Vaccine  Completed      07/23/2021   11:08 AM 12/07/2021    5:17 PM 12/07/2021    9:30 PM 12/08/2021    9:00 AM 05/09/2022    8:03 PM  Fall Risk  (RETIRED) Patient Fall Risk Level Low fall risk  Low fall risk  Moderate fall risk  Moderate fall risk  Moderate fall risk      Data saved with a previous flowsheet row definition   Functional Status Survey:    Vitals:   08/28/24 0906  BP: 135/85  Pulse: 71  Resp: 18  Temp: (!) 97.5 F (36.4 C)  SpO2: 94%  Weight: 162 lb 3.2 oz (73.6 kg)  Height: 5' 1 (1.549 m)   Body mass index is 30.65 kg/m. Physical Exam Constitutional:      General: She is not in acute distress.    Appearance: She is well-developed. She is not diaphoretic.  HENT:     Head: Normocephalic and atraumatic.     Mouth/Throat:     Pharynx: No oropharyngeal exudate.  Eyes:     Conjunctiva/sclera: Conjunctivae normal.     Pupils: Pupils are equal, round, and reactive to light.  Cardiovascular:     Rate and Rhythm: Normal rate and regular rhythm.     Heart sounds: Normal heart sounds.  Pulmonary:     Effort: Pulmonary effort is normal.     Breath sounds:  Normal breath sounds.  Abdominal:     General: Bowel sounds are normal.     Palpations: Abdomen is soft.  Musculoskeletal:     Cervical back: Normal range of motion and neck supple.     Right lower leg: No edema.     Left lower leg: No edema.  Skin:    General: Skin is warm and dry.  Neurological:     Mental Status: She is alert. Mental status is at baseline.  Psychiatric:        Mood and Affect: Mood normal.     Labs reviewed: Recent Labs    03/01/24 0000 04/05/24 0000  NA 140 141  K 4.3 4.2  CL 110* 109*  CO2 21 23*  BUN 30* 29*  CREATININE 1.4* 1.5*  CALCIUM 8.7 8.7   No results for input(s): AST, ALT, ALKPHOS, BILITOT, PROT, ALBUMIN in the last 8760 hours. Recent Labs    03/01/24 0000 04/05/24 0000  WBC 4.9 4.1  NEUTROABS 2,470.00 2,271.00  HGB 10.5* 11.6*  HCT 33* 37  PLT 163 161   Lab Results  Component Value Date   TSH 5.54 03/01/2024   No results found for: HGBA1C Lab Results  Component Value Date   CHOL 308 (H) 12/17/2013   HDL 33.80 (L) 12/17/2013   LDLCALC 218 (H) 12/17/2013   LDLDIRECT 127.6 03/17/2011   TRIG 283.0 (H) 12/17/2013   CHOLHDL 9 12/17/2013    Significant Diagnostic Results in last 30 days:  No results found.  Assessment/Plan Vitamin B12 deficiency Continues on b12 supplement  Primary osteoarthritis involving multiple joints Stable at this time, continues on voltaren gel PRN and tylenol  BID  Mood disorder Continues on zoloft .   Moderate dementia with behavioral disturbance (HCC) Stable, no acute changes in cognitive or functional status, continue supportive care.  Depakote added due to increase in agitation with positive effect Will continue at this time.   LOW BACK PAIN, CHRONIC Controlled on scheduled tylenol    Iron deficiency anemia Continue on iron supplement Follow up cbc  Hypothyroidism Continues on synthroid  88 mcg.  TSH stable on last lab  Chronic kidney disease, stage 3a (HCC) Chronic  and stable Encourage proper hydration Follow metabolic panel Avoid nephrotoxic meds (NSAIDS)     Hensley Aziz K. Caro BODILY [facility] [phone]

## 2024-08-28 NOTE — Assessment & Plan Note (Signed)
 Stable, no acute changes in cognitive or functional status, continue supportive care.  Depakote added due to increase in agitation with positive effect Will continue at this time.

## 2024-08-28 NOTE — Assessment & Plan Note (Signed)
 Stable at this time, continues on voltaren gel PRN and tylenol  BID

## 2024-10-08 ENCOUNTER — Encounter: Payer: Self-pay | Admitting: Orthopedic Surgery

## 2024-10-08 ENCOUNTER — Non-Acute Institutional Stay: Payer: Self-pay | Admitting: Orthopedic Surgery

## 2024-10-08 DIAGNOSIS — E039 Hypothyroidism, unspecified: Secondary | ICD-10-CM | POA: Diagnosis not present

## 2024-10-08 DIAGNOSIS — M150 Primary generalized (osteo)arthritis: Secondary | ICD-10-CM | POA: Diagnosis not present

## 2024-10-08 DIAGNOSIS — F03B18 Unspecified dementia, moderate, with other behavioral disturbance: Secondary | ICD-10-CM | POA: Diagnosis not present

## 2024-10-08 DIAGNOSIS — N1832 Chronic kidney disease, stage 3b: Secondary | ICD-10-CM

## 2024-10-08 DIAGNOSIS — D509 Iron deficiency anemia, unspecified: Secondary | ICD-10-CM | POA: Diagnosis not present

## 2024-10-08 DIAGNOSIS — F39 Unspecified mood [affective] disorder: Secondary | ICD-10-CM | POA: Diagnosis not present

## 2024-10-08 DIAGNOSIS — R635 Abnormal weight gain: Secondary | ICD-10-CM | POA: Diagnosis not present

## 2024-10-08 DIAGNOSIS — H6123 Impacted cerumen, bilateral: Secondary | ICD-10-CM

## 2024-10-08 DIAGNOSIS — E538 Deficiency of other specified B group vitamins: Secondary | ICD-10-CM | POA: Diagnosis not present

## 2024-10-08 NOTE — Progress Notes (Unsigned)
 " Location:  Other [facility].  Nursing Home Room Number: [facility] [id number] Place of Service:  ALF [phone]) Provider:  Greig Cluster, NP  PCP: Laurence Locus, DO  Patient Care Team: Laurence Locus, DO as PCP - General (Internal Medicine)  Extended Emergency Contact Information Primary Emergency Contact: [facility] Address: [address]          [place], [place] [address] [place] Home Phone: [phone] Work Phone: [phone] Mobile Phone: [phone] Relation: Daughter Secondary Emergency Contact: Hamed,Terry Address: [address]          [place], [place] [address] [place] Phone: [phone] Relation: Son  Code Status:  DNR Goals of care: Advanced Directive information    08/28/2024    9:32 AM  Advanced Directives  Does Patient Have a Medical Advance Directive? Yes  Type of Advance Directive Out of facility DNR (pink MOST or yellow form)  Does patient want to make changes to medical advance directive? No - Patient declined     Chief Complaint  Patient presents with   Medical Management of Chronic Issues    Medical Management of Chronic Issues.     HPI:  Pt is a 83 y.o. female seen today for medical management of chronic diseases.    She currently resides on the memory care unit at [facility]. PMH: MVP, HLD, AVM brain, GERD, hypothyroidism, dementia, osteoporosis, CKD, OA, anemia (iron def/B12) and mood disorder.   Dementia- CT head noted mild chronic small vessel ischemia 11/2021, wanders unit, able to be redirected, 09/29 Depakote started due to agitation> improved, dependent with some ADLs, social with others, AST/ALT 12/8 08/27/2024 Mood disturbance- Na+ 139 08/20/2024, remains on Zoloft  Hypothyroidism- TSH 5.54 02/2024, remains on levothyroxine  CKD- GFR 35 (11/03)> was 34 (06/19) Iron def anemia- hgb 10.9 08/20/2024, remains on ferrous sulfate B12 def - vitamin B12 785 08/20/2024, remains on cyanocobalamin  OA- stable  with tylenol    Recent weights:  12/04- 165 lbs  11/05- 162 lbs  10/04- 157 lbs  05/05- 151 lbs   Recent blood pressures:  12/17- 140/76  12/10- 142/70  12/03- 110/58        Past Medical History:  Diagnosis Date   Allergy    Dementia (HCC)    Depression    Osteoporosis 2006   Thyroid  disease    Past Surgical History:  Procedure Laterality Date   brain surgery angionoma  1988   CHOLECYSTECTOMY  06/15/2004   spondylosis  08/26/2003   L-S    Allergies[1]  Outpatient Encounter Medications as of 10/08/2024  Medication Sig   acetaminophen  (TYLENOL ) 325 MG tablet Take 650 mg by mouth every 8 (eight) hours as needed for mild pain (pain score 1-3) or moderate pain (pain score 4-6). Take Two tablets by mouth twice daily.   alum & mag hydroxide-simeth (ANTACID) 200-200-20 MG/5ML suspension Take 30 mLs by mouth every 4 (four) hours as needed for indigestion or heartburn.   benzocaine  (HURRICAINE) 20 % GEL Use as directed 1 Application in the mouth or throat 4 (four) times daily as needed.   carbamide peroxide (DEBROX) 6.5 % OTIC solution Place 5 drops into both ears 2 (two) times daily.   diclofenac Sodium (VOLTAREN) 1 % GEL Apply topically every 8 (eight) hours as needed (for pain).   divalproex (DEPAKOTE SPRINKLE) 125 MG capsule Take 125 mg by mouth daily.   ferrous sulfate 325 (65 FE) MG EC tablet Take 325 mg by mouth. Daily every Monday,  Wednesday and Friday.   guaiFENesin (ROBITUSSIN) 100 MG/5ML liquid Take 10 mLs by mouth every 4 (four) hours as needed for cough or to loosen phlegm.   hydrocortisone cream 1 % Apply 1 Application topically 4 (four) times daily.   levothyroxine  (SYNTHROID ) 88 MCG tablet Take 88 mcg by mouth daily before breakfast.   melatonin 1 MG TABS tablet Take 1 mg by mouth at bedtime.   polyethylene glycol (MIRALAX / GLYCOLAX) 17 g packet Take 17 g by mouth daily.   sertraline  (ZOLOFT ) 50 MG tablet Take 50 mg by mouth daily.   vitamin B-12  (CYANOCOBALAMIN ) 1000 MCG tablet Take 1,000 mcg by mouth daily.   Nitroglycerin 0.4 % OINT Place 1 Application rectally every 12 (twelve) hours as needed. (Patient not taking: Reported on 10/08/2024)   Wheat Dextrin (BENEFIBER PO) Take by mouth as needed. (Patient not taking: Reported on 10/08/2024)   No facility-administered encounter medications on file as of 10/08/2024.    Review of Systems  Unable to perform ROS: Dementia    Immunization History  Administered Date(s) Administered   Influenza Whole 07/18/2006, 10/24/2007, 07/30/2008, 08/13/2009, 07/28/2010   Influenza-Unspecified 08/07/2014, 07/31/2024   Pneumococcal Conjugate-13 07/15/2015   Pneumococcal Polysaccharide-23 08/13/2009, 08/02/2019   Td 11/29/2006   Zoster, Live 12/08/2012   Pertinent  Health Maintenance Due  Topic Date Due   Bone Density Scan  Never done   Mammogram  11/28/2015   Influenza Vaccine  Completed      07/23/2021   11:08 AM 12/07/2021    5:17 PM 12/07/2021    9:30 PM 12/08/2021    9:00 AM 05/09/2022    8:03 PM  Fall Risk  (RETIRED) Patient Fall Risk Level Low fall risk  Low fall risk  Moderate fall risk  Moderate fall risk  Moderate fall risk      Data saved with a previous flowsheet row definition   Functional Status Survey:    Vitals:   10/08/24 1200  BP: (!) 140/76  Pulse: 66  Resp: 14  Temp: (!) 97.1 F (36.2 C)  SpO2: 98%  Weight: 165 lb (74.8 kg)  Height: 5' 1 (1.549 m)   Body mass index is 31.18 kg/m. Physical Exam Vitals reviewed.  Constitutional:      General: She is not in acute distress. HENT:     Head: Normocephalic.     Right Ear: There is impacted cerumen.     Left Ear: There is impacted cerumen.     Nose: Nose normal.     Mouth/Throat:     Mouth: Mucous membranes are moist.  Eyes:     General:        Right eye: No discharge.        Left eye: No discharge.  Cardiovascular:     Rate and Rhythm: Normal rate and regular rhythm.     Pulses: Normal pulses.      Heart sounds: Normal heart sounds.  Pulmonary:     Effort: Pulmonary effort is normal.     Breath sounds: Normal breath sounds.  Abdominal:     General: Bowel sounds are normal. There is no distension.     Palpations: Abdomen is soft.     Tenderness: There is no abdominal tenderness.  Musculoskeletal:     Cervical back: Neck supple.     Right lower leg: No edema.     Left lower leg: No edema.  Skin:    General: Skin is warm.     Capillary Refill: Capillary  refill takes less than 2 seconds.  Neurological:     General: No focal deficit present.     Mental Status: She is alert. Mental status is at baseline.     Motor: Weakness present.     Gait: Gait abnormal.  Psychiatric:        Mood and Affect: Mood normal.     Labs reviewed: Recent Labs    03/01/24 0000 04/05/24 0000  NA 140 141  K 4.3 4.2  CL 110* 109*  CO2 21 23*  BUN 30* 29*  CREATININE 1.4* 1.5*  CALCIUM 8.7 8.7   Recent Labs    08/27/24 0000  AST 12*  ALT 8  ALKPHOS 54  ALBUMIN 4.0   Recent Labs    03/01/24 0000 04/05/24 0000  WBC 4.9 4.1  NEUTROABS 2,470.00 2,271.00  HGB 10.5* 11.6*  HCT 33* 37  PLT 163 161   Lab Results  Component Value Date   TSH 5.54 03/01/2024   No results found for: HGBA1C Lab Results  Component Value Date   CHOL 308 (H) 12/17/2013   HDL 33.80 (L) 12/17/2013   LDLCALC 218 (H) 12/17/2013   LDLDIRECT 127.6 03/17/2011   TRIG 283.0 (H) 12/17/2013   CHOLHDL 9 12/17/2013    Significant Diagnostic Results in last 30 days:  No results found.  Assessment/Plan 1. Cerumen debris on tympanic membrane of both ears (Primary) - start Debrox BID x 5 days - flush ears when Debrox complete   2. Weight gain - increased 14 lbs in 7 months  -  on Depakote - TSH mildly elevated  - cont monthly weights  3. Moderate dementia with behavioral disturbance (HCC) - stable mood - 09/29 Depakote started for agitation> AST/ALT normal - social with others  - dependent with some  ADLs - wanders unit - able to be redirected - cont memory care  4. Mood disorder - Na+ stable - cont Zoloft   5. Hypothyroidism, unspecified type - see above - if weights continue to increase> repeat TSH  - cont levothyroxine   6. Stage 3b chronic kidney disease (HCC) - encourage hydration with water - avoid NSAIDS  7. Iron deficiency anemia, unspecified iron deficiency anemia type - hgb stable - cont ferrous sulfate  8. Vitamin B12 deficiency - B12 level normal - cont cyanocobalamin   9. Primary osteoarthritis involving multiple joints - stable with tylenol      Family/ staff Communication: plan discussed with patient and nurse  Labs/tests ordered:  none        [1]  Allergies Allergen Reactions   Alendronate Sodium     REACTION: intolerant   Ezetimibe-Simvastatin      REACTION: side effects   Pravastatin Sodium     REACTION: intolerant   Relafen  [Nabumetone ]     Chest pain   Risedronate Sodium     REACTION: intolerant   "

## 2024-10-15 ENCOUNTER — Non-Acute Institutional Stay: Admitting: Orthopedic Surgery

## 2024-10-15 ENCOUNTER — Encounter: Payer: Self-pay | Admitting: Orthopedic Surgery

## 2024-10-15 DIAGNOSIS — Z0000 Encounter for general adult medical examination without abnormal findings: Secondary | ICD-10-CM

## 2024-10-15 NOTE — Progress Notes (Signed)
 "  Chief Complaint  Patient presents with   Medicare Wellness    AWV     Subjective:   Monica Edwards is a 83 y.o. female who presents for a Medicare Annual Wellness Visit.  Visit info / Clinical Intake: Medicare Wellness Visit Type:: Subsequent Annual Wellness Visit Persons participating in visit and providing information:: patient & caregiver Medicare Wellness Visit Mode:: In-person (required for WTM) Interpreter Needed?: No Pre-visit prep was completed: no AWV questionnaire completed by patient prior to visit?: no Living arrangements:: in nursing facility; in retirement community Patient's Overall Health Status Rating: good Typical amount of pain: some Does pain affect daily life?: no Are you currently prescribed opioids?: no  Dietary Habits and Nutritional Risks How many meals a day?: 3 Eats fruit and vegetables daily?: yes Most meals are obtained by: having others provide food In the last 2 weeks, have you had any of the following?: none Diabetic:: no  Fall Screening Falls in the past year?: 1 Number of falls in past year: 0 Was there an injury with Fall?: 1 Fall Risk Category Calculator: 2 Patient Fall Risk Level: Moderate Fall Risk  Fall Risk Patient at Risk for Falls Due to: Impaired balance/gait Fall risk Follow up: Falls evaluation completed  Home and Transportation Safety: All rugs have non-skid backing?: N/A, no rugs All stairs or steps have railings?: N/A, no stairs Grab bars in the bathtub or shower?: yes Have non-skid surface in bathtub or shower?: yes Good home lighting?: yes Regular seat belt use?: yes Hospital stays in the last year:: no  Cognitive Assessment Difficulty concentrating, remembering, or making decisions? : yes Will 6CIT or Mini Cog be Completed: no 6CIT or Mini Cog Declined: patient has a diagnosis of dementia or cognitive impairment  Advance Directives (For Healthcare) Does Patient Have a Medical Advance Directive?: Yes Does  patient want to make changes to medical advance directive?: No - Patient declined Type of Advance Directive: Out of facility DNR (pink MOST or yellow form) Copy of Healthcare Power of Attorney in Chart?: Yes - validated most recent copy scanned in chart (See row information) Copy of Living Will in Chart?: Yes - validated most recent copy scanned in chart (See row information) Out of facility DNR (pink MOST or yellow form) in Chart? (Ambulatory ONLY): No - copy requested Pre-existing out of facility DNR order (yellow form or pink MOST form): Physician notified to receive inpatient order  Reviewed/Updated  Reviewed/Updated: Reviewed All (Medical, Surgical, Family, Medications, Allergies, Care Teams, Patient Goals)    Allergies (verified) Alendronate sodium, Ezetimibe-simvastatin , Pravastatin sodium, Relafen  [nabumetone ], and Risedronate sodium   Current Medications (verified) Outpatient Encounter Medications as of 10/15/2024  Medication Sig   acetaminophen  (TYLENOL ) 325 MG tablet Take 650 mg by mouth every 8 (eight) hours as needed for mild pain (pain score 1-3) or moderate pain (pain score 4-6). Take Two tablets by mouth twice daily.   alum & mag hydroxide-simeth (ANTACID) 200-200-20 MG/5ML suspension Take 30 mLs by mouth every 4 (four) hours as needed for indigestion or heartburn.   benzocaine  (HURRICAINE) 20 % GEL Use as directed 1 Application in the mouth or throat 4 (four) times daily as needed.   carbamide peroxide (DEBROX) 6.5 % OTIC solution Place 5 drops into both ears 2 (two) times daily.   diclofenac Sodium (VOLTAREN) 1 % GEL Apply topically every 8 (eight) hours as needed (for pain).   divalproex (DEPAKOTE SPRINKLE) 125 MG capsule Take 125 mg by mouth daily.   ferrous sulfate  325 (65 FE) MG EC tablet Take 325 mg by mouth. Daily every Monday, Wednesday and Friday.   guaiFENesin (ROBITUSSIN) 100 MG/5ML liquid Take 10 mLs by mouth every 4 (four) hours as needed for cough or to loosen  phlegm.   hydrocortisone cream 1 % Apply 1 Application topically 4 (four) times daily.   levothyroxine  (SYNTHROID ) 88 MCG tablet Take 88 mcg by mouth daily before breakfast.   melatonin 1 MG TABS tablet Take 1 mg by mouth at bedtime.   polyethylene glycol (MIRALAX / GLYCOLAX) 17 g packet Take 17 g by mouth daily.   sertraline  (ZOLOFT ) 50 MG tablet Take 50 mg by mouth daily.   vitamin B-12 (CYANOCOBALAMIN ) 1000 MCG tablet Take 1,000 mcg by mouth daily.   No facility-administered encounter medications on file as of 10/15/2024.    History: Past Medical History:  Diagnosis Date   Allergy    Dementia (HCC)    Depression    Osteoporosis 2006   Thyroid  disease    Past Surgical History:  Procedure Laterality Date   brain surgery angionoma  1988   CHOLECYSTECTOMY  06/15/2004   spondylosis  08/26/2003   L-S   History reviewed. No pertinent family history. Social History   Occupational History   Not on file  Tobacco Use   Smoking status: Never   Smokeless tobacco: Never  Substance and Sexual Activity   Alcohol use: No   Drug use: No   Sexual activity: Not on file   Tobacco Counseling Counseling given: Not Answered  SDOH Screenings   Tobacco Use: Low Risk (10/15/2024)   See flowsheets for full screening details  Depression Screen Depression Screening Exception Documentation Depression Screening Exception:: Medical reason (dementia)     Goals Addressed   None          Objective:    Today's Vitals   10/15/24 1055  BP: (!) 114/56  Pulse: 83  Resp: 13  Temp: 97.8 F (36.6 C)  SpO2: 95%  Weight: 165 lb (74.8 kg)  Height: 5' 1 (1.549 m)   Body mass index is 31.18 kg/m.  Hearing/Vision screen No results found. Immunizations and Health Maintenance Health Maintenance  Topic Date Due   COVID-19 Vaccine (1) Never done   Zoster Vaccines- Shingrix (1 of 2) 08[birth year]10/1960   Bone Density Scan  Never done   Mammogram  11/28/2015   DTaP/Tdap/Td (2 - Tdap)  11/29/2016   Medicare Annual Wellness (AWV)  10/15/2025   Pneumococcal Vaccine: 50+ Years  Completed   Influenza Vaccine  Completed   Meningococcal B Vaccine  Aged Out        Assessment/Plan:  This is a routine wellness examination for Monica Edwards.  Patient Care Team: Laurence Locus, DO as PCP - General (Internal Medicine)  I have personally reviewed and noted the following in the patients chart:   Medical and social history Use of alcohol, tobacco or illicit drugs  Current medications and supplements including opioid prescriptions. Functional ability and status Nutritional status Physical activity Advanced directives List of other physicians Hospitalizations, surgeries, and ER visits in previous 12 months Vitals Screenings to include cognitive, depression, and falls Referrals and appointments  No orders of the defined types were placed in this encounter.  In addition, I have reviewed and discussed with patient certain preventive protocols, quality metrics, and best practice recommendations. A written personalized care plan for preventive services as well as general preventive health recommendations were provided to patient.   Greig FORBES Cluster, NP   10/15/2024  Return in 1 year (on 10/15/2025).  After Visit Summary: (In Person-Declined) Patient declined AVS at this time.  Nurse Notes: UTD on vaccinations. Will ask family about future mammograms and bone density.  "

## 2024-11-01 ENCOUNTER — Non-Acute Institutional Stay: Payer: Self-pay | Admitting: Internal Medicine

## 2024-11-01 ENCOUNTER — Encounter: Payer: Self-pay | Admitting: Internal Medicine

## 2024-11-01 DIAGNOSIS — M150 Primary generalized (osteo)arthritis: Secondary | ICD-10-CM

## 2024-11-01 DIAGNOSIS — F339 Major depressive disorder, recurrent, unspecified: Secondary | ICD-10-CM

## 2024-11-01 DIAGNOSIS — E039 Hypothyroidism, unspecified: Secondary | ICD-10-CM

## 2024-11-01 DIAGNOSIS — F03B18 Unspecified dementia, moderate, with other behavioral disturbance: Secondary | ICD-10-CM | POA: Diagnosis not present

## 2024-11-01 DIAGNOSIS — F39 Unspecified mood [affective] disorder: Secondary | ICD-10-CM | POA: Diagnosis not present

## 2024-11-01 DIAGNOSIS — E538 Deficiency of other specified B group vitamins: Secondary | ICD-10-CM

## 2024-11-01 DIAGNOSIS — N1831 Chronic kidney disease, stage 3a: Secondary | ICD-10-CM

## 2024-11-01 DIAGNOSIS — E782 Mixed hyperlipidemia: Secondary | ICD-10-CM | POA: Diagnosis not present

## 2024-11-01 DIAGNOSIS — D509 Iron deficiency anemia, unspecified: Secondary | ICD-10-CM

## 2024-11-01 NOTE — Progress Notes (Signed)
 [facility] Routine Visit Progress Note    Location:  [facility] Room Number: [facility] [id number] Place of Service:  ALF (13)   Laurence Locus, DO   Patient Care Team: Laurence Locus, DO as PCP - General (Internal Medicine)   Extended Emergency Contact Information Primary Emergency Contact: [facility] Address: [address]          [place], [place] [address] [place] Home Phone: [phone] Work Phone: [phone] Mobile Phone: [phone] Relation: Daughter Secondary Emergency Contact: Matthews,Terry Address: [address]          [place], [place] [address] [place] Mobile Phone: [phone] Relation: Son   Goals of care: Advanced Directive information    10/15/2024    1:07 PM  Advanced Directives  Type of Advance Directive Out of facility DNR (pink MOST or yellow form)  Does patient want to make changes to medical advance directive? No - Patient declined    CODE STATUS: Full Code Do Not Resuscitate (DNR)   Chief Complaint  Patient presents with   Routine Visit     HPI: Pt is a 84 y.o. female seen today for medical management of chronic disease.   Ms. Mckenlee is a 84 year old female who is being seen for routine medical care.  She lives at [facility] memory care in [place].  She is a long-term resident.  She has been at [facility] since [place] 2025.  She is currently getting a pedicure by the nail technician.  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 Take Two tablets by mouth twice daily.   alum & mag hydroxide-simeth (ANTACID) 200-200-20 MG/5ML suspension Take 30 mLs by mouth every 4 (four) hours as needed for indigestion or heartburn.   benzocaine  (HURRICAINE) 20 % GEL Use as directed 1 Application in the mouth or throat 4 (four) times daily as needed.   diclofenac Sodium (VOLTAREN) 1 % GEL Apply topically every 8 (eight) hours as needed (for pain).   divalproex (DEPAKOTE SPRINKLE) 125 MG capsule Take 125 mg by mouth daily.   ferrous sulfate 325 (65 FE) MG EC tablet Take 325 mg by mouth. Daily every Monday, Wednesday and Friday.   guaiFENesin (ROBITUSSIN) 100 MG/5ML liquid Take 10 mLs by mouth every 4 (four) hours as needed for cough or to loosen phlegm.   hydrocortisone cream 1 % Apply 1 Application topically 4 (four) times daily.   levothyroxine  (SYNTHROID ) 88 MCG tablet Take 88 mcg by mouth daily before breakfast.   melatonin 1 MG TABS tablet Take 1 mg by mouth at bedtime.   polyethylene glycol (MIRALAX / GLYCOLAX) 17 g packet Take 17 g by mouth as needed. Give 1 scoop by  mouth as needed for constipation daily if no stool in 3 days   sertraline  (ZOLOFT ) 50 MG tablet Take 50 mg by mouth daily.   vitamin B-12 (CYANOCOBALAMIN ) 1000 MCG tablet Take 1,000 mcg by mouth daily.   carbamide peroxide (DEBROX) 6.5 % OTIC solution Place 5 drops into both ears 2 (two) times daily. (Patient not taking: Reported on 11/01/2024)   No facility-administered encounter medications on file as of 11/01/2024.     Review of Systems  Unable to perform ROS: Dementia      Immunization History  Administered Date(s) Administered   Influenza Whole 07/18/2006,  10/24/2007, 07/30/2008, 08/13/2009, 07/28/2010   Influenza-Unspecified 08/07/2014, 07/31/2024   Pneumococcal Conjugate-13 07/15/2015   Pneumococcal Polysaccharide-23 08/13/2009, 08/02/2019   Td 11/29/2006   Zoster, Live 12/08/2012   Pertinent  Health Maintenance Due  Topic Date Due   Bone Density Scan  Never done   Mammogram  11/28/2015   Influenza Vaccine  Completed      12/07/2021    9:30 PM 12/08/2021    9:00 AM 05/09/2022    8:03 PM 10/09/2024    2:07 PM 10/15/2024    1:07 PM  Fall Risk  Falls in the past year?    1   Was there an injury with Fall?    1   Fall Risk Category Calculator    2   (RETIRED) Patient Fall Risk Level Moderate fall risk  Moderate fall risk  Moderate fall risk     Patient at Risk for Falls Due to    Impaired balance/gait Impaired balance/gait  Fall risk Follow up    Falls evaluation completed Falls evaluation completed     Data saved with a previous flowsheet row definition   Functional Status Survey:     Vitals:   11/01/24 1421  BP: 135/63  Pulse: 73  Resp: 16  Temp: 97.8 F (36.6 C)  SpO2: 96%  Weight: 75.9 kg  Height: 5' 1 (1.549 m)   Body mass index is 31.63 kg/m. Physical Exam Vitals and nursing note reviewed.  Constitutional:      General: She is not in acute distress.    Appearance: She is not toxic-appearing or diaphoretic.     Comments: Happily smiling for the nail technician as she is getting her pedicure done.  HENT:     Head: Normocephalic and atraumatic.  Eyes:     General: No scleral icterus. Cardiovascular:     Rate and Rhythm: Normal rate and regular rhythm.  Pulmonary:     Effort: Pulmonary effort is normal. No respiratory distress.     Breath sounds: Normal breath sounds.  Abdominal:     General: Bowel sounds are normal. There is no distension.     Palpations: Abdomen is soft.  Musculoskeletal:     Right lower leg: No edema.     Left lower leg: No edema.  Skin:    General: Skin is warm and dry.      Capillary Refill: Capillary refill takes less than 2 seconds.  Neurological:     Mental Status: She is alert.     Comments: Not oriented to person, place, time.  Oriented to self.  Pleasantly demented.      Labs reviewed: Recent Labs    03/01/24 0000 04/05/24 0000  NA 140 141  K 4.3 4.2  CL 110* 109*  CO2 21 23*  BUN 30* 29*  CREATININE 1.4* 1.5*  CALCIUM 8.7 8.7   Recent Labs    08/27/24  0000  AST 12*  ALT 8  ALKPHOS 54  ALBUMIN 4.0   Recent Labs    03/01/24 0000 04/05/24 0000  WBC 4.9 4.1  NEUTROABS 2,470.00 2,271.00  HGB 10.5* 11.6*  HCT 33* 37  PLT 163 161   Lab Results  Component Value Date   TSH 5.54 03/01/2024   No results found for: HGBA1C Lab Results  Component Value Date   CHOL 308 (H) 12/17/2013   HDL 33.80 (L) 12/17/2013   LDLCALC 218 (H) 12/17/2013   LDLDIRECT 127.6 03/17/2011   TRIG 283.0 (H) 12/17/2013   CHOLHDL 9 12/17/2013     Assessment/Plan Moderate dementia with behavioral disturbance (HCC) Continue with Depakote wrinkles 125 mg daily, sertraline  50 mg daily  Mood disorder Continue with Depakote 125 mg daily.  No emotional outbursts.  Chronic kidney disease, stage 3a (HCC) - baseline creatinine 1.5 Last BUN and creatinine obtained in June 2025 with a BUN of 29, creatinine 1.5  Primary osteoarthritis involving multiple joints Continue with scheduled Tylenol  650 mg twice daily and as needed, Voltaren gel as needed  Major depression, recurrent, chronic Continue with Zoloft  50 mg daily  Mixed hyperlipidemia Stable.  Patient is noted 80 cholesterol medications.  Acquired hypothyroidism Continue Synthroid  88 mcg daily  Iron deficiency anemia Continue with oral iron 325 mg Monday Wednesday Friday  Vitamin B12 deficiency Continue B12 supplementation 1000 mcg daily    Camellia Door, DO [facility] [phone]     [1]  Allergies Allergen Reactions   Alendronate Sodium     REACTION:  intolerant   Ezetimibe-Simvastatin      REACTION: side effects   Pravastatin Sodium     REACTION: intolerant   Relafen  [Nabumetone ]     Chest pain   Risedronate Sodium     REACTION: intolerant

## 2024-11-01 NOTE — Assessment & Plan Note (Signed)
 Continue with Depakote 125 mg daily.  No emotional outbursts.

## 2024-11-01 NOTE — Assessment & Plan Note (Signed)
 Continue B12 supplementation 1000 mcg daily.

## 2024-11-01 NOTE — Assessment & Plan Note (Signed)
 Continue with Zoloft 50 mg daily

## 2024-11-01 NOTE — Assessment & Plan Note (Signed)
 Continue Synthroid 88 mcg daily

## 2024-11-01 NOTE — Assessment & Plan Note (Signed)
 Stable.  Patient is noted 80 cholesterol medications.

## 2024-11-01 NOTE — Assessment & Plan Note (Signed)
 Continue with Depakote wrinkles 125 mg daily, sertraline  50 mg daily

## 2024-11-01 NOTE — Assessment & Plan Note (Addendum)
 Continue with scheduled Tylenol  650 mg twice daily and as needed, Voltaren gel as needed

## 2024-11-01 NOTE — Assessment & Plan Note (Signed)
 Continue with oral iron 325 mg Monday Wednesday Friday

## 2024-11-01 NOTE — Assessment & Plan Note (Signed)
 Last BUN and creatinine obtained in June 2025 with a BUN of 29, creatinine 1.5
